# Patient Record
Sex: Female | Born: 1980 | Marital: Married | Smoking: Former smoker
Health system: Southern US, Community
[De-identification: ages and names within clinical notes are randomized; demographics above are authoritative.]

## PROBLEM LIST (undated history)

## (undated) HISTORY — PX: BREAST SURGERY: SHX581

## (undated) HISTORY — PX: TUBAL LIGATION: SHX77

## (undated) HISTORY — PX: ABDOMINAL HYSTERECTOMY: SHX81

---

## 2015-03-22 ENCOUNTER — Encounter (HOSPITAL_BASED_OUTPATIENT_CLINIC_OR_DEPARTMENT_OTHER): Payer: Self-pay

## 2015-03-22 ENCOUNTER — Ambulatory Visit (HOSPITAL_BASED_OUTPATIENT_CLINIC_OR_DEPARTMENT_OTHER)
Admission: RE | Admit: 2015-03-22 | Discharge: 2015-03-22 | Disposition: A | Discharge status: Home / Self Care | Payer: BLUE CROSS/BLUE SHIELD | Source: Ambulatory Visit | Attending: Family Medicine | Admitting: Family Medicine

## 2015-03-22 ENCOUNTER — Other Ambulatory Visit (HOSPITAL_BASED_OUTPATIENT_CLINIC_OR_DEPARTMENT_OTHER): Payer: Self-pay | Admitting: Family Medicine

## 2015-03-22 DIAGNOSIS — R1031 Right lower quadrant pain: Secondary | ICD-10-CM | POA: Diagnosis not present

## 2015-03-22 DIAGNOSIS — R102 Pelvic and perineal pain: Secondary | ICD-10-CM

## 2015-03-22 MED ORDER — IOHEXOL 300 MG/ML  SOLN
100.0000 mL | Freq: Once | INTRAMUSCULAR | Status: AC | PRN
  Administered 2015-03-22: 100 mL via INTRAVENOUS

## 2016-01-26 ENCOUNTER — Encounter: Payer: Self-pay | Admitting: Emergency Medicine

## 2016-01-26 ENCOUNTER — Emergency Department
Admission: EM | Admit: 2016-01-26 | Discharge: 2016-01-26 | Disposition: A | Discharge status: Home / Self Care | Payer: PRIVATE HEALTH INSURANCE | Source: Home / Self Care | Attending: Family Medicine | Admitting: Family Medicine

## 2016-01-26 DIAGNOSIS — M62830 Muscle spasm of back: Secondary | ICD-10-CM

## 2016-01-26 DIAGNOSIS — M5441 Lumbago with sciatica, right side: Secondary | ICD-10-CM

## 2016-01-26 MED ORDER — METHYLPREDNISOLONE ACETATE 80 MG/ML IJ SUSP
80.0000 mg | Freq: Once | INTRAMUSCULAR | Status: AC
  Administered 2016-01-26: 80 mg via INTRAMUSCULAR

## 2016-01-26 MED ORDER — TRAMADOL HCL 50 MG PO TABS: 50.0000 mg | ORAL_TABLET | Freq: Four times a day (QID) | ORAL | 0 refills | Status: DC | PRN

## 2016-01-26 MED ORDER — CYCLOBENZAPRINE HCL 10 MG PO TABS: 10.0000 mg | ORAL_TABLET | Freq: Two times a day (BID) | ORAL | 0 refills | Status: DC | PRN

## 2016-01-26 MED ORDER — PREDNISONE 20 MG PO TABS: ORAL_TABLET | ORAL | 0 refills | Status: DC

## 2016-01-26 NOTE — ED Provider Notes (Signed)
CSN: [id number]     Arrival date & time 01/26/16  1147 History   First MD Initiated Contact with Patient 01/26/16 1208     Chief Complaint  Patient presents with  . Back Pain   (Consider location/radiation/quality/duration/timing/severity/associated sxs/prior Treatment) HPI  Linda Villarreal is a 35 y.o. female presenting to UC with c/o sudden onset lower back pain that started around 3AM this morning when pt work up for work. 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 She did not go to work today. Pt notes she was unable to fully sit up in bed due to severe sharp pain and muscle spasm in her back.  Pain is 8/10 at worst. No medication taken PTA as pt notes she is allergic to ibuprofen- "throat closes up" and acetaminophen "doesn't do anything."  Pain radiates minimally down Right leg w/o numbness or weakness in legs. Denies change in bowel or bladder habits. Pain is worse with position changes. She has had mild back pain in the past but never this bad. She does not recall any specific injuries but she did go to [place] 2 days ago and was on some roller coasters.  She did not have any back pain yesterday. No hx of back surgeries.    History reviewed. No pertinent past medical history. History reviewed. No pertinent surgical history. History reviewed. No pertinent family history. Social History  Substance Use Topics  . Smoking status: Former Research scientist (life sciences)  . Smokeless tobacco: Never Used  . Alcohol use No   OB History    No data available     Review of Systems  Constitutional: Negative for chills and fever.  Genitourinary: Negative for dysuria, flank pain, frequency and hematuria.  Musculoskeletal: Positive for back pain, gait problem ( due to back pain) and myalgias. Negative for arthralgias and joint swelling.  Skin: Negative for color change and rash.  Neurological: Negative for weakness and numbness.    Allergies  Ibuprofen  Home Medications   Prior to Admission  medications   Medication Sig Start Date End Date Taking? Authorizing Provider  cyclobenzaprine (FLEXERIL) 10 MG tablet Take 1 tablet (10 mg total) by mouth 2 (two) times daily as needed for muscle spasms. 01/26/16   Noland Fordyce, PA-C  predniSONE (DELTASONE) 20 MG tablet 3 tabs po day one, then 2 po daily x 4 days 01/26/16   Noland Fordyce, PA-C  traMADol (ULTRAM) 50 MG tablet Take 1 tablet (50 mg total) by mouth every 6 (six) hours as needed. 01/26/16   Noland Fordyce, PA-C   Meds Ordered and Administered this Visit   Medications  methylPREDNISolone acetate (DEPO-MEDROL) injection 80 mg (80 mg Intramuscular Given 01/26/16 1220)    BP 141/92 (BP Location: Left Arm)   Pulse 72   Temp 98 F (36.7 C) (Oral)   Resp 18   Ht 5\' 4"  (1.626 m)   Wt 179 lb 12 oz (81.5 kg)   SpO2 98%   BMI 30.85 kg/m  No data found.   Physical Exam  Constitutional: She is oriented to person, place, and time. She appears well-developed and well-nourished.  HENT:  Head: Normocephalic and atraumatic.  Eyes: EOM are normal.  Neck: Normal range of motion.  Cardiovascular: Normal rate.   Pulmonary/Chest: Effort normal.  Musculoskeletal: Normal range of motion. She exhibits tenderness. She exhibits no edema.  No midline spinal tenderness. Mild tenderness to Right lower lumbar muscles and Right buttock.  Slow change in positions from sitting to standing and sitting  to lying due to pain. Unable to perform Right straight leg raise due to severe pain. Full flexion and extension and knees and ankles with 5/5 strength. Antalgic gait.   Neurological: She is alert and oriented to person, place, and time.  Reflex Scores:      Patellar reflexes are 2+ on the right side and 2+ on the left side. Skin: Skin is warm and dry.  Psychiatric: She has a normal mood and affect. Her behavior is normal.  Nursing note and vitals reviewed.   Urgent Care Course   Clinical Course    Procedures (including critical care  time)  Labs Review Labs Reviewed - No data to display  Imaging Review No results found.    MDM   1. Muscle spasm of back   2. Bilateral low back pain with right-sided sciatica    Pt c/o sudden onset lower back pain and muscle spasms. No red flag symptoms.  No hx of known injury. No imaging indicated at this time.  Depomedrol 80mg  IM given in UC along with heat pack.  Rx: tramadol, flexeril and prednisone  Home care instructions and exercises provided. Work note provided for tomorrow off, return Tuesday with light duty for 3 days. F/u with PCP or Sports Medicine if not improving or for recurrent back pain.  Patient verbalized understanding and agreement with treatment plan.     Noland Fordyce, PA-C 01/26/16 1238

## 2016-01-26 NOTE — ED Notes (Signed)
No adverse reaction noted to the injection Depomedrol

## 2016-01-26 NOTE — ED Triage Notes (Signed)
Patient woke this morning with C/O lower back pain 8/10 sharp constant pain with occasional spasms. Patient went to [employer] two days ago, questionable injury.

## 2016-01-26 NOTE — Discharge Instructions (Signed)
°  Flexeril is a muscle relaxer and may cause drowsiness. Do not drink alcohol, drive, or operate heavy machinery while taking.  Tramadol is strong pain medication. While taking, do not drink alcohol, drive, or perform any other activities that requires focus while taking these medications.   You were given a shot of depomedrol (a steroid) today to help with inflammation and pain in your back.  You have been prescribed 5 days of prednisone, an oral steroid.  You may start this medication tomorrow with breakfast.

## 2016-01-31 ENCOUNTER — Emergency Department (INDEPENDENT_AMBULATORY_CARE_PROVIDER_SITE_OTHER): Payer: PRIVATE HEALTH INSURANCE

## 2016-01-31 ENCOUNTER — Emergency Department (INDEPENDENT_AMBULATORY_CARE_PROVIDER_SITE_OTHER)
Admission: EM | Admit: 2016-01-31 | Discharge: 2016-01-31 | Disposition: A | Discharge status: Home / Self Care | Payer: PRIVATE HEALTH INSURANCE | Source: Home / Self Care | Attending: Family Medicine | Admitting: Family Medicine

## 2016-01-31 ENCOUNTER — Encounter: Payer: Self-pay | Admitting: Emergency Medicine

## 2016-01-31 DIAGNOSIS — M609 Myositis, unspecified: Secondary | ICD-10-CM

## 2016-01-31 DIAGNOSIS — M545 Low back pain, unspecified: Secondary | ICD-10-CM

## 2016-01-31 DIAGNOSIS — M7521 Bicipital tendinitis, right shoulder: Secondary | ICD-10-CM

## 2016-01-31 DIAGNOSIS — IMO0001 Reserved for inherently not codable concepts without codable children: Secondary | ICD-10-CM | POA: Insufficient documentation

## 2016-01-31 DIAGNOSIS — M461 Sacroiliitis, not elsewhere classified: Secondary | ICD-10-CM

## 2016-01-31 DIAGNOSIS — M7711 Lateral epicondylitis, right elbow: Secondary | ICD-10-CM

## 2016-01-31 DIAGNOSIS — M791 Myalgia: Secondary | ICD-10-CM | POA: Diagnosis not present

## 2016-01-31 NOTE — ED Provider Notes (Signed)
[facility]    CSN: [id number] Arrival date & time: 01/31/16  1215  First Provider Contact:  First MD Initiated Contact with Patient 01/31/16 1237        History   Chief Complaint Chief Complaint  Patient presents with  . Back Pain    HPI Linda Villarreal is a 35 y.o. female.   Patient was evaluated and treated here 5 days ago for bilateral low back pain with right side sciatica.  She received DepoMedrol injection followed by oral prednisone, tramadol, and Flexeril. She reports that her back pain has not improved, and radiates to her right leg. She also complains of vague tingling in her right forearm to her right shoulder.   The history is provided by the patient.    History reviewed. No pertinent past medical history.  Patient Active Problem List   Diagnosis Date Noted  . Right low back pain 01/31/2016  . Myalgia and myositis     History reviewed. No pertinent surgical history.  OB History    No data available       Home Medications    Prior to Admission medications   Medication Sig Start Date End Date Taking? Authorizing Provider  cyclobenzaprine (FLEXERIL) 10 MG tablet Take 1 tablet (10 mg total) by mouth 2 (two) times daily as needed for muscle spasms. 01/26/16   Noland Fordyce, PA-C  traMADol (ULTRAM) 50 MG tablet Take 1 tablet (50 mg total) by mouth every 6 (six) hours as needed. 01/26/16   Noland Fordyce, PA-C    Family History No family history on file.  Social History Social History  Substance Use Topics  . Smoking status: Former Research scientist (life sciences)  . Smokeless tobacco: Never Used  . Alcohol use No     Allergies   Ibuprofen   Review of Systems Review of Systems  Constitutional: Negative.   HENT: Negative.   Eyes: Negative.   Respiratory: Negative.   Cardiovascular: Negative.   Gastrointestinal: Negative.   Genitourinary: Negative.   Musculoskeletal: Positive for back pain.  Skin: Negative.   Neurological: Negative.       Physical Exam Triage Vital Signs ED Triage Vitals  Enc Vitals Group     BP 01/31/16 1230 142/86     Pulse Rate 01/31/16 1230 97     Resp --      Temp 01/31/16 1230 98.2 F (36.8 C)     Temp Source 01/31/16 1230 Oral     SpO2 01/31/16 1230 97 %     Weight 01/31/16 1230 186 lb (84.4 kg)     Height 01/31/16 1230 5\' 5"  (1.651 m)     Head Circumference --      Peak Flow --      Pain Score 01/31/16 1232 7     Pain Loc --      Pain Edu? --      Excl. in [place]? --    No data found.   Updated Vital Signs BP 142/86 (BP Location: Left Arm)   Pulse 97   Temp 98.2 F (36.8 C) (Oral)   Ht 5\' 5"  (1.651 m)   Wt 186 lb (84.4 kg)   SpO2 97%   BMI 30.95 kg/m   Visual Acuity Right Eye Distance:   Left Eye Distance:   Bilateral Distance:    Right Eye Near:   Left Eye Near:    Bilateral Near:     Physical Exam  Constitutional: She is oriented to person, place, and time. She  appears well-developed and well-nourished. No distress.  HENT:  Head: Normocephalic.  Nose: Nose normal.  Mouth/Throat: Oropharynx is clear and moist.  Eyes: Conjunctivae are normal. Pupils are equal, round, and reactive to light.  Neck: Normal range of motion.  Cardiovascular: Normal heart sounds.   Pulmonary/Chest: Breath sounds normal.  Abdominal: Bowel sounds are normal.  Musculoskeletal: She exhibits no edema.       Back:       Arms: Back:  Range of motion relatively well preserved.  Can heel/toe walk and squat without difficulty.   Tenderness in the midline at approximately L5-S1.  Tenderness in the left paraspinous muscles from L3 to Sacral area.  Straight leg raising test is negative.  Sitting knee extension test is negative.  Strength and sensation in the lower extremities is normal.  Patellar and achilles reflexes are normal.  FABER positive for right SI joint.  There is distinct tenderness over the right lateral epicondyle.  Palpation there during resisted dorsiflexion and supination of the  wrist recreates right elbow pain.  There is distinct tenderness to palpation over the insertion of the long head of the right biceps tendon.   Neurological: She is alert and oriented to person, place, and time.  Nursing note and vitals reviewed.    UC Treatments / Results  Labs (all labs ordered are listed, but only abnormal results are displayed) Labs Reviewed - No data to display  EKG  EKG Interpretation None       Radiology No results found.  Procedures Procedures (including critical care time)  Medications Ordered in UC Medications - No data to display   Initial Impression / Assessment and Plan / UC Course  I have reviewed the triage vital signs and the nursing notes.  Pertinent labs & imaging results that were available during my care of the patient were reviewed by me and considered in my medical decision making (see chart for details).  Clinical Course  Will arrange consultation with Dr. Aundria Mems for local SI joint injection and further management. May continue muscle relaxant Begin right shoulder and elbow exercises. Followup with Dr. Aundria Mems     Final Clinical Impressions(s) / UC Diagnoses   Final diagnoses:  SI (sacroiliac) joint inflammation ([place])  Right lateral epicondylitis  Biceps tendonitis, right    New Prescriptions Discharge Medication List as of 01/31/2016  3:30 PM       Kandra Nicolas, MD 02/09/16 1356

## 2016-01-31 NOTE — Discharge Instructions (Addendum)
May continue muscle relaxant. Begin right shoulder and elbow exercises.

## 2016-01-31 NOTE — Consult Note (Signed)
   Subjective:    I'm seeing this patient as a consultation for:  Dr. Theone Murdoch  CC: Right-sided back pain  HPI: This is a pleasant 35 year old female with a several day history of worsening back pain in the right side localized at the SI joint, she's had a few courses of steroids by urgent care providers, tramadol, muscle relaxers without much improvement. Pain is localized, worse with sitting, flexion, Valsalva and localized over the SI joint.  Past medical history, Surgical history, Family history not pertinant except as noted below, Social history, Allergies, and medications have been entered into the medical record, reviewed, and no changes needed.   Review of Systems: No headache, visual changes, nausea, vomiting, diarrhea, constipation, dizziness, abdominal pain, skin rash, fevers, chills, night sweats, weight loss, swollen lymph nodes, body aches, joint swelling, muscle aches, chest pain, shortness of breath, mood changes, visual or auditory hallucinations.   Objective:   General: Well Developed, well nourished, and in no acute distress.  Neuro/Psych: Alert and oriented x3, extra-ocular muscles intact, able to move all 4 extremities, sensation grossly intact. Skin: Warm and dry, no rashes noted.  Respiratory: Not using accessory muscles, speaking in full sentences, trachea midline.  Cardiovascular: Pulses palpable, no extremity edema. Abdomen: Does not appear distended. Back Exam:  Inspection: Unremarkable  Motion: Flexion 45 deg, Extension 45 deg, Side Bending to 45 deg bilaterally,  Rotation to 45 deg bilaterally  SLR laying: Negative  XSLR laying: Negative  Palpable tenderness: Right SI joint. FABER: negative. Sensory change: Gross sensation intact to all lumbar and sacral dermatomes.  Reflexes: 2+ at both patellar tendons, 2+ at achilles tendons, Babinski's downgoing.  Strength at foot  Plantar-flexion: 5/5 Dorsi-flexion: 5/5 Eversion: 5/5 Inversion: 5/5  Leg strength   Quad: 5/5 Hamstring: 5/5 Hip flexor: 5/5 Hip abductors: 5/5  Gait unremarkable.  Procedure: Real-time Ultrasound Guided Injection of right SI joint Device: GE Logiq E  Verbal informed consent obtained.  Time-out conducted.  Noted no overlying erythema, induration, or other signs of local infection.  Skin prepped in a sterile fashion.  Local anesthesia: Topical Ethyl chloride.  With sterile technique and under real time ultrasound guidance:  1 mL kenalog 40, 2 mL lidocaine, 2 mL Marcaine injected easily. Completed without difficulty  Pain immediately resolved suggesting accurate placement of the medication.  Advised to call if fevers/chills, erythema, induration, drainage, or persistent bleeding.  Images permanently stored and available for review in the ultrasound unit.  Impression: Technically successful ultrasound guided injection.  Impression and Recommendations:   This case required medical decision making of moderate complexity.  Right-sided low back pain: Suspicious for SI joint dysfunction with a discogenic component. SI joint injection, physical therapy, MRI, return to see me in one month.  No problem-specific Assessment & Plan notes found for this encounter.  ___________________________________________ Gwen Her. Dianah Field, M.D., ABFM., CAQSM. Primary Care and [place] Instructor of [place] [employer] [facility]

## 2016-01-31 NOTE — ED Triage Notes (Signed)
Pt was here on Sunday, back pain not getting better now right sided back pain radiates down right leg and arm, fingers tingling, 7/10

## 2017-02-19 ENCOUNTER — Emergency Department
Admission: EM | Admit: 2017-02-19 | Discharge: 2017-02-19 | Disposition: A | Discharge status: Home / Self Care | Payer: Self-pay | Source: Home / Self Care | Attending: Family Medicine | Admitting: Family Medicine

## 2017-02-19 DIAGNOSIS — R52 Pain, unspecified: Secondary | ICD-10-CM

## 2017-02-19 DIAGNOSIS — J019 Acute sinusitis, unspecified: Secondary | ICD-10-CM

## 2017-02-19 DIAGNOSIS — J069 Acute upper respiratory infection, unspecified: Secondary | ICD-10-CM

## 2017-02-19 MED ORDER — AMOXICILLIN-POT CLAVULANATE 875-125 MG PO TABS: 1.0000 | ORAL_TABLET | Freq: Two times a day (BID) | ORAL | 0 refills | Status: DC

## 2017-02-19 NOTE — ED Provider Notes (Signed)
[facility]    CSN: [id number] Arrival date & time: 02/19/17  1710     History   Chief Complaint Chief Complaint  Patient presents with  . Sore Throat  . Headache    HPI YARIELIS FUNARO is a 36 y.o. female.   HPI  ANY MCNEICE is a 36 y.o. female presenting to UC with c/o 1 week worsening sinus congestion, and sore throat with head pressure and pain behind Left ear and burning in chest since yesterday.  She has been out of work all week due to feeling bed. Denies fever or chills but has had nausea and 1 episode of vomiting this past week.  She notes her daughter "brought the sickness home from school."    History reviewed. No pertinent past medical history.  Patient Active Problem List   Diagnosis Date Noted  . Right low back pain 01/31/2016  . Myalgia and myositis     Past Surgical History:  Procedure Laterality Date  . ABDOMINAL HYSTERECTOMY    . BREAST SURGERY     Augmentation  . CESAREAN SECTION    . TUBAL LIGATION      OB History    No data available       Home Medications    Prior to Admission medications   Medication Sig Start Date End Date Taking? Authorizing Provider  amoxicillin-clavulanate (AUGMENTIN) 875-125 MG tablet Take 1 tablet by mouth 2 (two) times daily. One po bid x 7 days 02/19/17   Noe Gens, PA-C  cyclobenzaprine (FLEXERIL) 10 MG tablet Take 1 tablet (10 mg total) by mouth 2 (two) times daily as needed for muscle spasms. 01/26/16   Noe Gens, PA-C  traMADol (ULTRAM) 50 MG tablet Take 1 tablet (50 mg total) by mouth every 6 (six) hours as needed. 01/26/16   Noe Gens, PA-C    Family History History reviewed. No pertinent family history.  Social History Social History  Substance Use Topics  . Smoking status: Former Research scientist (life sciences)  . Smokeless tobacco: Never Used  . Alcohol use No     Allergies   Ibuprofen   Review of Systems Review of Systems  Constitutional: Positive for fatigue. Negative for chills  and fever.  HENT: Positive for congestion, ear pain (Left), rhinorrhea, sinus pain, sinus pressure and sore throat. Negative for trouble swallowing and voice change.   Respiratory: Positive for cough. Negative for shortness of breath.   Cardiovascular: Negative for chest pain and palpitations.  Gastrointestinal: Positive for nausea and vomiting. Negative for abdominal pain and diarrhea.  Musculoskeletal: Positive for arthralgias and myalgias. Negative for back pain.  Skin: Negative for rash.  Neurological: Positive for headaches. Negative for dizziness and light-headedness.     Physical Exam Triage Vital Signs ED Triage Vitals [02/19/17 1734]  Enc Vitals Group     BP 134/87     Pulse Rate 96     Resp      Temp 98.3 F (36.8 C)     Temp Source Oral     SpO2 97 %     Weight 181 lb (82.1 kg)     Height 5\' 5"  (1.651 m)     Head Circumference      Peak Flow      Pain Score 5     Pain Loc      Pain Edu?      Excl. in [place]?    No data found.   Updated Vital Signs BP 134/87 (BP  Location: Left Arm)   Pulse 96   Temp 98.3 F (36.8 C) (Oral)   Ht 5\' 5"  (1.651 m)   Wt 181 lb (82.1 kg)   SpO2 97%   BMI 30.12 kg/m   Visual Acuity Right Eye Distance:   Left Eye Distance:   Bilateral Distance:    Right Eye Near:   Left Eye Near:    Bilateral Near:     Physical Exam  Constitutional: She is oriented to person, place, and time. She appears well-developed and well-nourished. No distress.  HENT:  Head: Normocephalic and atraumatic.  Right Ear: Tympanic membrane normal.  Left Ear: Tympanic membrane normal.  Nose: Mucosal edema present. Right sinus exhibits maxillary sinus tenderness and frontal sinus tenderness. Left sinus exhibits maxillary sinus tenderness and frontal sinus tenderness.  Mouth/Throat: Uvula is midline and mucous membranes are normal. Posterior oropharyngeal erythema present. No oropharyngeal exudate, posterior oropharyngeal edema or tonsillar abscesses.  Eyes:  EOM are normal.  Neck: Normal range of motion. Neck supple.  Cardiovascular: Normal rate and regular rhythm.   Pulmonary/Chest: Effort normal and breath sounds normal. No stridor. No respiratory distress. She has no wheezes. She has no rales.  Musculoskeletal: Normal range of motion.  Lymphadenopathy:    She has no cervical adenopathy.  Neurological: She is alert and oriented to person, place, and time.  Skin: Skin is warm and dry. She is not diaphoretic.  Psychiatric: She has a normal mood and affect. Her behavior is normal.  Nursing note and vitals reviewed.    UC Treatments / Results  Labs (all labs ordered are listed, but only abnormal results are displayed) Labs Reviewed - No data to display  EKG  EKG Interpretation None       Radiology No results found.  Procedures Procedures (including critical care time)  Medications Ordered in UC Medications - No data to display   Initial Impression / Assessment and Plan / UC Course  I have reviewed the triage vital signs and the nursing notes.  Pertinent labs & imaging results that were available during my care of the patient were reviewed by me and considered in my medical decision making (see chart for details).     Hx and exam c/w sinus infection, will cover for bacterial cause given worsening symptoms. F/u with PCP in 1 week if needed.   Final Clinical Impressions(s) / UC Diagnoses   Final diagnoses:  Acute rhinosinusitis  Body aches  Upper respiratory tract infection, unspecified type    New Prescriptions Discharge Medication List as of 02/19/2017  5:50 PM    START taking these medications   Details  amoxicillin-clavulanate (AUGMENTIN) 875-125 MG tablet Take 1 tablet by mouth 2 (two) times daily. One po bid x 7 days, Starting Fri 02/19/2017, Normal         Controlled Substance Prescriptions [place] Controlled Substance Registry consulted? Not Applicable   Tyrell Antonio 02/19/17 1819

## 2017-02-19 NOTE — ED Triage Notes (Signed)
Pt started with a sore throat and headache last Friday.  All week has been very fatigued.  Yesterday started with head pressure, ear pain behind the ear, and burning in her chest.  Has been out of work all week.

## 2017-02-19 NOTE — Discharge Instructions (Signed)
°  You may take 500mg acetaminophen every 4-6 hours or in combination with ibuprofen 400-600mg every 6-8 hours as needed for pain, inflammation, and fever. ° °Be sure to drink at least eight 8oz glasses of water to stay well hydrated and get at least 8 hours of sleep at night, preferably more while sick.  ° °

## 2017-02-21 ENCOUNTER — Telehealth: Payer: Self-pay | Admitting: Emergency Medicine

## 2017-03-26 ENCOUNTER — Other Ambulatory Visit: Payer: Self-pay | Admitting: Podiatry

## 2017-03-26 ENCOUNTER — Ambulatory Visit (INDEPENDENT_AMBULATORY_CARE_PROVIDER_SITE_OTHER): Payer: 59 | Admitting: Podiatry

## 2017-03-26 ENCOUNTER — Ambulatory Visit (INDEPENDENT_AMBULATORY_CARE_PROVIDER_SITE_OTHER): Payer: 59

## 2017-03-26 ENCOUNTER — Encounter: Payer: Self-pay | Admitting: Podiatry

## 2017-03-26 VITALS — BP 131/86 | HR 80 | Resp 16

## 2017-03-26 DIAGNOSIS — M79671 Pain in right foot: Secondary | ICD-10-CM | POA: Diagnosis not present

## 2017-03-26 DIAGNOSIS — M779 Enthesopathy, unspecified: Secondary | ICD-10-CM

## 2017-03-26 DIAGNOSIS — M7751 Other enthesopathy of right foot: Secondary | ICD-10-CM | POA: Diagnosis not present

## 2017-03-26 DIAGNOSIS — Q742 Other congenital malformations of lower limb(s), including pelvic girdle: Secondary | ICD-10-CM | POA: Diagnosis not present

## 2017-03-26 DIAGNOSIS — M25571 Pain in right ankle and joints of right foot: Secondary | ICD-10-CM

## 2017-03-26 MED ORDER — TRIAMCINOLONE ACETONIDE 10 MG/ML IJ SUSP
10.0000 mg | Freq: Once | INTRAMUSCULAR | Status: AC
  Administered 2017-03-26: 10 mg

## 2017-03-26 MED ORDER — DICLOFENAC SODIUM 75 MG PO TBEC: 75.0000 mg | DELAYED_RELEASE_TABLET | Freq: Two times a day (BID) | ORAL | 2 refills | Status: DC

## 2017-03-26 NOTE — Progress Notes (Signed)
Subjective:    Patient ID: Linda Villarreal, female   DOB: 36 y.o.   MRN: [id number]   HPI patient presents stating she's had chronic pain in the inside of her right ankle for the last few years and is developed a lot of pain in the outside of the right ankle over the last few months. 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 Patient does not smoke currently    Review of Systems  All other systems reviewed and are negative.       Objective:  Physical Exam  Constitutional: She appears well-developed and well-nourished.  Cardiovascular: Intact distal pulses.   Pulmonary/Chest: Effort normal.  Musculoskeletal: Normal range of motion.  Neurological: She is alert.  Skin: Skin is warm.  Nursing note and vitals reviewed.  neurovascular status intact muscle strength adequate range of motion was within normal limits. Patient was found to have quite a bit of discomfort in the posterior tibial insertion right with hypertrophy of the navicular around this area and probability for accessory navicular. There is quite a bit of acute pain in the outside of the right ankle along the lateral ankle gutter and also the sinus tarsi is moderately tender when pressed. 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 I did careful lateral sheath injection 3 mg Kenalog 5 mg Xylocaine and I applied fascial brace to lift up the lateral side of the foot and I then went ahead discussed ice therapy and anti-inflammatories and long-term correction of the accessory navicular due to two-year history of problem. She is placed on diclofenac 75  mg twice a day and will be seen back to recheck and we will need to get x-rays of both feet at that time with oblique views to determine the extensiveness of the navicular  X-rays indicate probable accessory navicular right with moderate depression of the arch and no indication of ankle instability

## 2017-03-26 NOTE — Progress Notes (Signed)
   Subjective:    Patient ID: Linda Villarreal, female    DOB: [birth year], 36 y.o.   MRN: [id number]  HPI Chief Complaint  Patient presents with  . Foot Pain    Lateral foot and ankle/medial foot right - aching and swelling x 2 months, "feels like something is moving", ortho said had acessory navicular, did PT - no better      Review of Systems  All other systems reviewed and are negative.      Objective:   Physical Exam        Assessment & Plan:

## 2017-04-09 ENCOUNTER — Encounter: Payer: Self-pay | Admitting: Podiatry

## 2017-04-09 ENCOUNTER — Ambulatory Visit (INDEPENDENT_AMBULATORY_CARE_PROVIDER_SITE_OTHER): Payer: 59

## 2017-04-09 ENCOUNTER — Ambulatory Visit (INDEPENDENT_AMBULATORY_CARE_PROVIDER_SITE_OTHER): Payer: 59 | Admitting: Podiatry

## 2017-04-09 DIAGNOSIS — M7751 Other enthesopathy of right foot: Secondary | ICD-10-CM

## 2017-04-09 DIAGNOSIS — D169 Benign neoplasm of bone and articular cartilage, unspecified: Secondary | ICD-10-CM

## 2017-04-09 DIAGNOSIS — M7750 Other enthesopathy of unspecified foot: Secondary | ICD-10-CM

## 2017-04-09 NOTE — Progress Notes (Signed)
Subjective:    Patient ID: Linda Villarreal, female   DOB: 36 y.o.   MRN: [id number]   HPI patient states the outside of her ankle is some improved from previous but still sore and she continues to have the same chronic problem with the inside of the ankle and wants to get it fixed    ROS      Objective:  Physical Exam neurovascular status intact muscle strength adequate range of motion within normal limits with patient found to have inflammation in the outside of the right foot that still present but improved with significant inflammation pain of the posterior tibial tendon insertion right that's chronic in nature and has been present for a long time with depression of the arch noted and enlargement of the bone structure over the left navicular     Assessment:   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 I do think that this is can require excision of accessory navicular with tightening the posterior tibial tendon and a Kidner type procedure. I explained to her at great length and reviewed what would be required surgically and she wants to get this done but needs to wait till January. At this point I allowed her to read consent form going over all complications and the fact that total recovery from this will take 6 months to one year and she will be nonweightbearing for probably around 2-4 weeks. Patient understands all this and signs consent form after extensive review and discussion of alternative treatments complications. I will also inject the lateral tendon complex along with the Kidner procedure and I dispensed air fracture walker with all instructions on usage and I like her to wear prior surgery to reduce inflammation    X-ray report indicated that there is large accessory navicular on the medial side of  the right navicular with small accessory navicular on the left

## 2017-04-09 NOTE — Patient Instructions (Signed)
Pre-Operative Instructions  Congratulations, you have decided to take an important step towards improving your quality of life.  You can be assured that the doctors and staff at [facility] will be with you every step of the way.  Here are some important things you should know:  1. Plan to be at the surgery center/hospital at least 1 (one) hour prior to your scheduled time, unless otherwise directed by the surgical center/hospital staff.  You must have a responsible adult accompany you, remain during the surgery and drive you home.  Make sure you have directions to the surgical center/hospital to ensure you arrive on time. 2. If you are having surgery at [facility] or [facility], you will need a copy of your medical history and physical form from your family physician within one month prior to the date of surgery. We will give you a form for your primary physician to complete.  3. We make every effort to accommodate the date you request for surgery.  However, there are times where surgery dates or times have to be moved.  We will contact you as soon as possible if a change in schedule is required.   4. No aspirin/ibuprofen for one week before surgery.  If you are on aspirin, any non-steroidal anti-inflammatory medications (Mobic, Aleve, Ibuprofen) should not be taken seven (7) days prior to your surgery.  You make take Tylenol for pain prior to surgery.  5. Medications - If you are taking daily heart and blood pressure medications, seizure, reflux, allergy, asthma, anxiety, pain or diabetes medications, make sure you notify the surgery center/hospital before the day of surgery so they can tell you which medications you should take or avoid the day of surgery. 6. No food or drink after midnight the night before surgery unless directed otherwise by surgical center/hospital staff. 7. No alcoholic beverages 24-hours prior to surgery.  No smoking 24-hours prior or 24-hours after  surgery. 8. Wear loose pants or shorts. They should be loose enough to fit over bandages, boots, and casts. 9. Don't wear slip-on shoes. Sneakers are preferred. 10. Bring your boot with you to the surgery center/hospital.  Also bring crutches or a walker if your physician has prescribed it for you.  If you do not have this equipment, it will be provided for you after surgery. 11. If you have not been contacted by the surgery center/hospital by the day before your surgery, call to confirm the date and time of your surgery. 12. Leave-time from work may vary depending on the type of surgery you have.  Appropriate arrangements should be made prior to surgery with your employer. 13. Prescriptions will be provided immediately following surgery by your doctor.  Fill these as soon as possible after surgery and take the medication as directed. Pain medications will not be refilled on weekends and must be approved by the doctor. 14. Remove nail polish on the operative foot and avoid getting pedicures prior to surgery. 15. Wash the night before surgery.  The night before surgery wash the foot and leg well with water and the antibacterial soap provided. Be sure to pay special attention to beneath the toenails and in between the toes.  Wash for at least three (3) minutes. Rinse thoroughly with water and dry well with a towel.  Perform this wash unless told not to do so by your physician.  Enclosed: 1 Ice pack (please put in freezer the night before surgery)   1 Hibiclens skin cleaner     Pre-op instructions  If you have any questions regarding the instructions, please do not hesitate to call our office.  [place]: [address], [place], [place] [address] -- [phone]  [place]: [address] [place], [place] [address] -- [phone]  [place]: [address]  [place], [place] [address] -- [phone]  [place]: [address], [address], [place], [place] [address] -- [phone]  Website:  [email or web address] 

## 2017-04-29 ENCOUNTER — Encounter: Payer: Self-pay | Admitting: Podiatry

## 2017-04-29 ENCOUNTER — Ambulatory Visit (INDEPENDENT_AMBULATORY_CARE_PROVIDER_SITE_OTHER): Payer: 59 | Admitting: Podiatry

## 2017-04-29 ENCOUNTER — Ambulatory Visit (INDEPENDENT_AMBULATORY_CARE_PROVIDER_SITE_OTHER): Payer: 59

## 2017-04-29 VITALS — BP 141/97 | HR 83 | Resp 16

## 2017-04-29 DIAGNOSIS — S99921A Unspecified injury of right foot, initial encounter: Secondary | ICD-10-CM

## 2017-04-29 DIAGNOSIS — M7751 Other enthesopathy of right foot: Secondary | ICD-10-CM

## 2017-04-29 NOTE — Progress Notes (Signed)
Subjective:   Patient ID: Linda Villarreal, female   DOB: 36 y.o.   MRN: [id number]   HPI Patient presents stating that she dropped a pallet at work on her right foot on the back and it has been very sore.  She is worried that she might have broken a bone and she is having trouble walking.  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   ROS      Objective:  Physical Exam  Neurovascular status intact quite a bit of inflammation in the lateral side of the right foot around the perineal complex with the patient's medial arch still very sore with surgery scheduled for this coming February.  There is redness on the outside of the right foot but there is no abrasion or break in skin with moderate edema noted     Assessment:  Acute injury to the right lateral foot consistent with inflammation with possibility for fracture     Plan:  H&P x-rays reviewed and condition discussed.  At this time I applied an Unna boot Ace wrap surgical shoe to completely take stress off the lateral foot and patient will be seen back again to recheck may require complete immobilization.  Patient is to be off work for the next 4 days and hopefully she will be able to return to work next week without a problem.  X-rays indicate there is no indication currently of fracture ablation of accessory navicular that is going to be correct February.

## 2017-05-13 ENCOUNTER — Telehealth: Payer: Self-pay | Admitting: *Deleted

## 2017-05-13 ENCOUNTER — Telehealth: Payer: Self-pay | Admitting: Podiatry

## 2017-05-13 ENCOUNTER — Encounter: Payer: Self-pay | Admitting: *Deleted

## 2017-05-13 NOTE — Telephone Encounter (Signed)
Linda Villarreal  needs to know if she can go back to work and if so she needs the ok per Dr. Paulla Dolly. She stated she left a message and I did let her know we where out two days. Please call [phone]

## 2017-05-13 NOTE — Telephone Encounter (Signed)
Pt asked if she needed to be seen again to get note to return to work, Dr. Paulla Dolly stated at last visit she could return to work 05/03/2017, but has been out of work this week and would like to return work 05/17/2017. Dr. Josephina Shih pt to return to work 05/17/2017, no restrictions noted on 04/29/2017 office note. Pt states she will pick up the note in the [place] office.

## 2017-06-01 HISTORY — PX: FOOT SURGERY: SHX648

## 2017-06-02 ENCOUNTER — Encounter: Payer: Self-pay | Admitting: Podiatry

## 2017-06-02 ENCOUNTER — Ambulatory Visit (INDEPENDENT_AMBULATORY_CARE_PROVIDER_SITE_OTHER): Payer: 59

## 2017-06-02 ENCOUNTER — Other Ambulatory Visit: Payer: Self-pay | Admitting: Podiatry

## 2017-06-02 ENCOUNTER — Ambulatory Visit (INDEPENDENT_AMBULATORY_CARE_PROVIDER_SITE_OTHER): Payer: 59 | Admitting: Podiatry

## 2017-06-02 VITALS — BP 118/86 | HR 90 | Resp 16

## 2017-06-02 DIAGNOSIS — T148XXA Other injury of unspecified body region, initial encounter: Secondary | ICD-10-CM

## 2017-06-02 DIAGNOSIS — S99921D Unspecified injury of right foot, subsequent encounter: Secondary | ICD-10-CM

## 2017-06-02 NOTE — Progress Notes (Signed)
Subjective:   Patient ID: Linda Villarreal, female   DOB: 37 y.o.   MRN: [id number]   HPI Patient presents stating she feels like the outside of her right foot is bothering him more after the injury she sustained at work.  Patient states she feels like it is popping and is not stable and has become increasingly difficult for her to work or walk over the last 10-14 days   ROS      Objective:  Physical Exam  Neurovascular status intact with what appears to be a dislocating peroneal tendon right with the possibility that she may have sustained a sheath injury when she had her injury at the end of November and also there is a possibility of perineal tear right with instability after the injury she sustained     Assessment:  Possibility for dislocating peroneal tendon versus possibility for tear     Plan:  H&P condition reviewed and at this time I am sending for MRI to rule out a tear of the peroneal tendon and also to evaluate the sheath on the lateral side.  Patient will be seen back when we get results and we are to move her surgery up as she needs to get it done as soon as possible due to the pain she is in.  I dispensed a full ankle stabilizer to try to buy her time and provide for a degree of stability

## 2017-06-09 ENCOUNTER — Ambulatory Visit
Admission: RE | Admit: 2017-06-09 | Discharge: 2017-06-09 | Disposition: A | Discharge status: Home / Self Care | Payer: 59 | Source: Ambulatory Visit | Attending: Podiatry | Admitting: Podiatry

## 2017-06-09 DIAGNOSIS — S99921D Unspecified injury of right foot, subsequent encounter: Secondary | ICD-10-CM

## 2017-06-09 DIAGNOSIS — T148XXA Other injury of unspecified body region, initial encounter: Secondary | ICD-10-CM

## 2017-06-14 ENCOUNTER — Ambulatory Visit (INDEPENDENT_AMBULATORY_CARE_PROVIDER_SITE_OTHER): Payer: 59 | Admitting: Podiatry

## 2017-06-14 ENCOUNTER — Encounter: Payer: Self-pay | Admitting: Podiatry

## 2017-06-14 DIAGNOSIS — D169 Benign neoplasm of bone and articular cartilage, unspecified: Secondary | ICD-10-CM | POA: Diagnosis not present

## 2017-06-14 DIAGNOSIS — T148XXA Other injury of unspecified body region, initial encounter: Secondary | ICD-10-CM | POA: Diagnosis not present

## 2017-06-14 NOTE — Progress Notes (Signed)
Subjective:   Patient ID: Linda Villarreal, female   DOB: 37 y.o.   MRN: [id number]   HPI Patient presents for consult concerning pain in the lateral side of her right ankle.  She is scheduled for surgery on her navicular to be done in February but on November 28 had an injury at work where a pallet traumatized her right foot and ankle and twisted her foot and she developed significant pain after that.  We did apply a immobilizing boot for several weeks and she attempted to return to work but the pain was too intense and we did order an MRI that we had just received results   ROS      Objective:  Physical Exam  MRI indicates a tear of the peroneus longus that most likely occurred with the injury that occurred in November as there was no pain prior to that injury.  There is also some popping which could indicate some damage to the sheath that is allowing the tendon to pop and a lateral direction out of its groove     Assessment:  Chronic accessory navicular bone right with a probable acute tear of the peroneal tendon     Plan:  Reviewed condition at great length and we are going to go ahead and at the same time that we fix the navicular will going to repair the peroneal tendon.  Also may have to re-groove it depending on how it looks intraoperatively and will have to make that decision at the time of surgery.  Patient at this time is scheduled for outpatient surgery and was given all preoperative instructions and the fact that this is a difficult surgery and that there is no long-term guarantees and the recovery can take anywhere from 6 months to 1 year and she will need physical therapy postoperatively.  She it will have the surgery move out due to the tear of the peroneal tendon and we will be able to get this at next week for her and again she does understand that there will be a non-weightbearing.  For probably anywhere between 3 and 6 weeks

## 2017-06-14 NOTE — Patient Instructions (Signed)
Pre-Operative Instructions  Congratulations, you have decided to take an important step towards improving your quality of life.  You can be assured that the doctors and staff at [facility] will be with you every step of the way.  Here are some important things you should know:  1. Plan to be at the surgery center/hospital at least 1 (one) hour prior to your scheduled time, unless otherwise directed by the surgical center/hospital staff.  You must have a responsible adult accompany you, remain during the surgery and drive you home.  Make sure you have directions to the surgical center/hospital to ensure you arrive on time. 2. If you are having surgery at [facility] or [facility], you will need a copy of your medical history and physical form from your family physician within one month prior to the date of surgery. We will give you a form for your primary physician to complete.  3. We make every effort to accommodate the date you request for surgery.  However, there are times where surgery dates or times have to be moved.  We will contact you as soon as possible if a change in schedule is required.   4. No aspirin/ibuprofen for one week before surgery.  If you are on aspirin, any non-steroidal anti-inflammatory medications (Mobic, Aleve, Ibuprofen) should not be taken seven (7) days prior to your surgery.  You make take Tylenol for pain prior to surgery.  5. Medications - If you are taking daily heart and blood pressure medications, seizure, reflux, allergy, asthma, anxiety, pain or diabetes medications, make sure you notify the surgery center/hospital before the day of surgery so they can tell you which medications you should take or avoid the day of surgery. 6. No food or drink after midnight the night before surgery unless directed otherwise by surgical center/hospital staff. 7. No alcoholic beverages 24-hours prior to surgery.  No smoking 24-hours prior or 24-hours after  surgery. 8. Wear loose pants or shorts. They should be loose enough to fit over bandages, boots, and casts. 9. Don't wear slip-on shoes. Sneakers are preferred. 10. Bring your boot with you to the surgery center/hospital.  Also bring crutches or a walker if your physician has prescribed it for you.  If you do not have this equipment, it will be provided for you after surgery. 11. If you have not been contacted by the surgery center/hospital by the day before your surgery, call to confirm the date and time of your surgery. 12. Leave-time from work may vary depending on the type of surgery you have.  Appropriate arrangements should be made prior to surgery with your employer. 13. Prescriptions will be provided immediately following surgery by your doctor.  Fill these as soon as possible after surgery and take the medication as directed. Pain medications will not be refilled on weekends and must be approved by the doctor. 14. Remove nail polish on the operative foot and avoid getting pedicures prior to surgery. 15. Wash the night before surgery.  The night before surgery wash the foot and leg well with water and the antibacterial soap provided. Be sure to pay special attention to beneath the toenails and in between the toes.  Wash for at least three (3) minutes. Rinse thoroughly with water and dry well with a towel.  Perform this wash unless told not to do so by your physician.  Enclosed: 1 Ice pack (please put in freezer the night before surgery)   1 Hibiclens skin cleaner     Pre-op instructions  If you have any questions regarding the instructions, please do not hesitate to call our office.  [place]: [address], [place], [place] [address] -- [phone]  [place]: [address] [place], [place] [address] -- [phone]  [place]: [address]  , [place] [address] -- [phone]  [place]: [address], [place], [place] [address] -- [phone]  Website:  [email or web address] 

## 2017-06-15 ENCOUNTER — Telehealth: Payer: Self-pay | Admitting: Podiatry

## 2017-06-15 NOTE — Telephone Encounter (Signed)
I need someone to call me back about medical records that I need to have faxed to my work. My call back number is [phone]. Thank you. Bye bye.

## 2017-06-15 NOTE — Telephone Encounter (Signed)
Called pt back in regards to message she left about her medical records. She stated she needs her records sent to the injury department at her job in order for her to get paid after she has her surgery. I told her we would need her to fill out and sign a medical records release form authorizing [place] to release her records. I told her we could e-mail her one, fax one, or she could come by the office, which ever was more convenient for her. Pt asked if she could come by tomorrow morning and I told her that was fine. I told her to ask to speak to Marcie Bal when she came in as [employer] handles all of the paperwork and records for FMLA/Short Term Disability after people had surgery. I told her our hours tomorrow are from 8:00 am - 5:00 pm. I told her to call us with any other questions or concerns.

## 2017-06-23 ENCOUNTER — Encounter: Payer: Self-pay | Admitting: Podiatry

## 2017-06-23 ENCOUNTER — Telehealth: Payer: Self-pay | Admitting: Podiatry

## 2017-06-23 DIAGNOSIS — T148XXD Other injury of unspecified body region, subsequent encounter: Secondary | ICD-10-CM | POA: Diagnosis not present

## 2017-06-23 DIAGNOSIS — M25774 Osteophyte, right foot: Secondary | ICD-10-CM | POA: Diagnosis not present

## 2017-06-23 DIAGNOSIS — T148XXA Other injury of unspecified body region, initial encounter: Secondary | ICD-10-CM | POA: Diagnosis not present

## 2017-06-23 NOTE — Telephone Encounter (Signed)
This is Mae Immunologist from [employer]. I'm calling for the prescription written for percocet. If you would please give [place] a call back at [phone]. Thank you so much. Bye bye.

## 2017-06-23 NOTE — Telephone Encounter (Signed)
Linda Villarreal states percocet 10/325mg  prescribed today equals morphine 130mEq, they can only fill for 43mEq which is one tablet 3 times a day dispensing #21. Dr. Paulla Dolly states that is fine. I informed Linda it was okay to change Percocet 10/325 #21 to one tablet every 8 hours.

## 2017-06-25 ENCOUNTER — Telehealth: Payer: Self-pay | Admitting: Podiatry

## 2017-06-25 MED ORDER — OXYCODONE-ACETAMINOPHEN 10-325 MG PO TABS: 1.0000 | ORAL_TABLET | Freq: Three times a day (TID) | ORAL | 0 refills | Status: DC | PRN

## 2017-06-25 NOTE — Addendum Note (Signed)
Addended by: Harriett Sine D on: 06/25/2017 03:04 PM   Modules accepted: Orders

## 2017-06-25 NOTE — Telephone Encounter (Signed)
I asked pt to describe the pain and she states it is sharp. I told pt that she should only be up on the foot no more than 15 minutes/hour and pt states she only gets up to go to the bathroom, but she doesn't put her weight on it and I told her that was good. I told pt then it may be the positioning of the ace wrap, to remove the boot, open-ended sock and the ace wrap only, then elevate the foot for 15 minutes, after 15 minutes lower foot to hip level and rewrap the ace looser starting at the toes and wrap slightly further up the leg, re apply the sock and boot. I told pt the percocet 10/325mg  could be prescribed again and I would fill it as every 8 hour and she should take it as every 8 hours. Pt states understanding and will send her husband to pick up.

## 2017-06-25 NOTE — Telephone Encounter (Signed)
I had surgery on Wednesday with Dr. Paulla Dolly. The prescription that Dr. Paulla Dolly had prescribed for me the pharmacy wouldn't fill the whole thing because the quantity he prescribed was more than allowed for percocet. I've been having to take every four hours due to the pain of both surgeries on my one foot. So I was wondering if I could get the last of my prescription filled so that I don't run out because I've been in a lot of pain. If you could give me a call back. My number is [phone]. Thanks.

## 2017-06-28 ENCOUNTER — Other Ambulatory Visit: Payer: 59

## 2017-06-30 ENCOUNTER — Encounter: Payer: Self-pay | Admitting: Podiatry

## 2017-06-30 ENCOUNTER — Ambulatory Visit (INDEPENDENT_AMBULATORY_CARE_PROVIDER_SITE_OTHER): Payer: 59

## 2017-06-30 ENCOUNTER — Ambulatory Visit (INDEPENDENT_AMBULATORY_CARE_PROVIDER_SITE_OTHER): Payer: 59 | Admitting: Podiatry

## 2017-06-30 VITALS — BP 141/91

## 2017-06-30 DIAGNOSIS — M7751 Other enthesopathy of right foot: Secondary | ICD-10-CM

## 2017-06-30 DIAGNOSIS — T148XXA Other injury of unspecified body region, initial encounter: Secondary | ICD-10-CM | POA: Diagnosis not present

## 2017-06-30 MED ORDER — OXYCODONE-ACETAMINOPHEN 10-325 MG PO TABS: 1.0000 | ORAL_TABLET | Freq: Three times a day (TID) | ORAL | 0 refills | Status: DC | PRN

## 2017-06-30 NOTE — Progress Notes (Signed)
Subjective:   Patient ID: Linda Villarreal, female   DOB: 37 y.o.   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  Gave instruction continue elevation compression immobilization and reappoint 2 weeks or earlier if needed  X-rays indicate that satisfactory resection of bone has occurred medial side right foot

## 2017-07-05 NOTE — Progress Notes (Signed)
DOS 06/23/17 Repair tendon Rt, Kidner advanced tendon Rt.

## 2017-07-08 ENCOUNTER — Encounter: Payer: Self-pay | Admitting: Podiatry

## 2017-07-08 ENCOUNTER — Telehealth: Payer: Self-pay | Admitting: *Deleted

## 2017-07-08 NOTE — Telephone Encounter (Signed)
"  I'm calling to inquire about Linda Villarreal.  I would like to know if it was filed with Workers' Comp or did she file it with her insurance."  I do not know you would need to speak to someone in insurance in regards to that, I can transfer you to the insurance department.  "I have other questions as well.  Will this person be able to answer questions regarding the surgery itself?"  I can answer those questions.  "I guess I don't need to know about the insurance.  When is her surgery scheduled?"  She had surgery on 06/23/2017.  "She did?  What's her diagnosis?"  She had a torn tendon.  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  "Okay, thank you so much.  That's all I need to know."

## 2017-07-14 ENCOUNTER — Ambulatory Visit (INDEPENDENT_AMBULATORY_CARE_PROVIDER_SITE_OTHER): Payer: 59 | Admitting: Podiatry

## 2017-07-14 ENCOUNTER — Encounter: Payer: Self-pay | Admitting: Podiatry

## 2017-07-14 ENCOUNTER — Ambulatory Visit (INDEPENDENT_AMBULATORY_CARE_PROVIDER_SITE_OTHER): Payer: 59

## 2017-07-14 DIAGNOSIS — T148XXA Other injury of unspecified body region, initial encounter: Secondary | ICD-10-CM

## 2017-07-14 DIAGNOSIS — D169 Benign neoplasm of bone and articular cartilage, unspecified: Secondary | ICD-10-CM | POA: Diagnosis not present

## 2017-07-14 DIAGNOSIS — R600 Localized edema: Secondary | ICD-10-CM

## 2017-07-15 ENCOUNTER — Telehealth: Payer: Self-pay | Admitting: *Deleted

## 2017-07-15 DIAGNOSIS — Z9889 Other specified postprocedural states: Secondary | ICD-10-CM

## 2017-07-15 NOTE — Progress Notes (Signed)
Subjective:   Patient ID: Linda Villarreal, female   DOB: 37 y.o.   MRN: [id number]   HPI Patient seems to be a little bit improved but states she still has swelling in her ankle and she has been nonweightbearing   ROS      Objective:  Physical Exam  Neurovascular status intact with patient found to be doing okay with incision sites both medial lateral right ankle doing well with negative Homans sign noted and wound edges well coapted with no drainage with staples stitches intact     Assessment:  Doing okay with having had perineal repair and Kidner procedure right with swelling secondary to the procedure and the nonweightbearing status     Plan:  H&P condition reviewed and gradual weightbearing encouraged.  I applied an Unna boot to try to reduce the swelling and will get weight and take the remaining staples out in 1 week if she cannot tolerate it well.  Patient also will start physical therapy and she is referred for physical therapy to try to improve motor with mobilization and reduce edema in the right ankle

## 2017-07-15 NOTE — Telephone Encounter (Signed)
Dr. Paulla Dolly ordered PT [facility] - In-office.

## 2017-07-21 ENCOUNTER — Other Ambulatory Visit: Payer: 59

## 2017-07-22 ENCOUNTER — Ambulatory Visit (INDEPENDENT_AMBULATORY_CARE_PROVIDER_SITE_OTHER): Payer: Self-pay

## 2017-07-22 DIAGNOSIS — D169 Benign neoplasm of bone and articular cartilage, unspecified: Secondary | ICD-10-CM

## 2017-07-22 DIAGNOSIS — T148XXA Other injury of unspecified body region, initial encounter: Secondary | ICD-10-CM

## 2017-07-23 NOTE — Progress Notes (Signed)
Patient presents status post repair tendon Rt, Kidner advanced tendon Rt on 1.23.19 stating that she has had an improvement in pain and is able to walk short distances at this time.   Noted improvement in swelling to medial and lateral sides of the ankle. No redness, erythema, warmth or drainage.  Surgical incision are healing well, removed some staples and sutures from both surgical sites. Noted mild gapping and slight drainage to some of the areas. Left remaining sutures and staples intact, applied light adhesive around surgical incisions and applied steri-strips to support.   Advised to keep her foot dry, minimize walking to help aid in healing and swelling. RE-appointed next week for removal of remaining sutures and staples.

## 2017-07-29 ENCOUNTER — Ambulatory Visit (INDEPENDENT_AMBULATORY_CARE_PROVIDER_SITE_OTHER): Payer: Self-pay

## 2017-07-29 DIAGNOSIS — D169 Benign neoplasm of bone and articular cartilage, unspecified: Secondary | ICD-10-CM

## 2017-07-29 DIAGNOSIS — T148XXA Other injury of unspecified body region, initial encounter: Secondary | ICD-10-CM

## 2017-08-11 NOTE — Progress Notes (Signed)
Patient presents status post repair tendon Rt, Kidner advanced tendon Rt on 1.23.19 stating that she has had an improvement in pain and is able to walk short distances at this time.   Noted improvement in swelling to medial and lateral sides of the ankle. No redness, erythema, warmth or drainage.  Surgical incision are healing well, removed all staples and sutures, applied steri-strips.   Advised to walk in post op boot. She going to [place] to visit her mother who is not doing well, she is to return and follow up with Dr Paulla Dolly in 2 weeks or sooner if needed

## 2017-08-12 ENCOUNTER — Ambulatory Visit (INDEPENDENT_AMBULATORY_CARE_PROVIDER_SITE_OTHER): Payer: 59

## 2017-08-12 ENCOUNTER — Encounter: Payer: Self-pay | Admitting: Podiatry

## 2017-08-12 ENCOUNTER — Ambulatory Visit (INDEPENDENT_AMBULATORY_CARE_PROVIDER_SITE_OTHER): Payer: Self-pay | Admitting: Podiatry

## 2017-08-12 DIAGNOSIS — T148XXA Other injury of unspecified body region, initial encounter: Secondary | ICD-10-CM

## 2017-08-12 NOTE — Progress Notes (Signed)
Subjective:   Patient ID: Linda Villarreal, female   DOB: 37 y.o.   MRN: [id number]   HPI Patient states overall doing well but still having quite a bit of pain and swelling if she is on her foot too much neurovascular status intact negative Homans sign noted with patient's right perineal incision and   ROS      Objective:  Physical Exam  Posterior tibial incisions healing well with continued edema but reduced over time and good range of motion     Assessment:  Doing well overall with swelling still noted normal for these types of procedures     Plan:  Continue with compression elevation and immobilization and reappoint 4 weeks or earlier if needed.  X-rays indicate satisfactory resection of bone we also discussed the fact her mother has liver cancer and only has 3-6 months to live

## 2017-09-13 ENCOUNTER — Encounter: Payer: 59 | Admitting: Podiatry

## 2017-09-24 ENCOUNTER — Ambulatory Visit (INDEPENDENT_AMBULATORY_CARE_PROVIDER_SITE_OTHER): Payer: 59

## 2017-09-24 ENCOUNTER — Encounter: Payer: Self-pay | Admitting: Podiatry

## 2017-09-24 ENCOUNTER — Ambulatory Visit (INDEPENDENT_AMBULATORY_CARE_PROVIDER_SITE_OTHER): Payer: Self-pay | Admitting: Podiatry

## 2017-09-24 DIAGNOSIS — T148XXA Other injury of unspecified body region, initial encounter: Secondary | ICD-10-CM

## 2017-09-26 NOTE — Progress Notes (Signed)
Subjective:   Patient ID: Linda Villarreal, female   DOB: 37 y.o.   MRN: [id number]   HPI Patient states overall doing well but still having swelling diffuse if she is on her foot for too long of a time    ROS      Objective:  Physical Exam  Neurovascular status was found to be intact with incisions on the lateral side of the right ankle and midfoot right doing well with minimal inflammation noted and mild discomfort with movement of inversion and eversion     Assessment:  Doing well overall with patient responding well to conservative treatment postsurgically     Plan:  Final x-rays reviewed and recommended continued compression elevation and gradual return to normal activity.  Will occur but it is going to take time due to the intensity of the surgery performed  X-ray indicates that the patient's right midfoot is doing well with excellent resection of bone in good alignment noted

## 2018-05-16 ENCOUNTER — Emergency Department (HOSPITAL_BASED_OUTPATIENT_CLINIC_OR_DEPARTMENT_OTHER): Payer: Self-pay

## 2018-05-16 ENCOUNTER — Emergency Department (HOSPITAL_BASED_OUTPATIENT_CLINIC_OR_DEPARTMENT_OTHER)
Admission: EM | Admit: 2018-05-16 | Discharge: 2018-05-16 | Disposition: A | Discharge status: Home / Self Care | Payer: Self-pay | Attending: Emergency Medicine | Admitting: Emergency Medicine

## 2018-05-16 ENCOUNTER — Other Ambulatory Visit: Payer: Self-pay

## 2018-05-16 ENCOUNTER — Encounter (HOSPITAL_BASED_OUTPATIENT_CLINIC_OR_DEPARTMENT_OTHER): Payer: Self-pay | Admitting: Emergency Medicine

## 2018-05-16 DIAGNOSIS — M79661 Pain in right lower leg: Secondary | ICD-10-CM | POA: Insufficient documentation

## 2018-05-16 DIAGNOSIS — Z87891 Personal history of nicotine dependence: Secondary | ICD-10-CM | POA: Insufficient documentation

## 2018-05-16 MED ORDER — HYDROCODONE-ACETAMINOPHEN 5-325 MG PO TABS: 1.0000 | ORAL_TABLET | ORAL | 0 refills | Status: AC | PRN

## 2018-05-16 NOTE — ED Provider Notes (Signed)
[facility] EMERGENCY DEPARTMENT Provider Note   CSN: [id number] Arrival date & time: 05/16/18  1003     History   Chief Complaint Chief Complaint  Patient presents with  . Leg Pain    HPI EISA CONAWAY is a 37 y.o. female.  Pt presents to the ED today with RLE pain.  The pt said it's been there for 1 week.  It hurts with movement.  No swelling.  No travel.  No injury.     No past medical history on file.  Patient Active Problem List   Diagnosis Date Noted  . Right low back pain 01/31/2016  . Myalgia and myositis     Past Surgical History:  Procedure Laterality Date  . ABDOMINAL HYSTERECTOMY     endometriosis  . BREAST SURGERY     Augmentation  . CESAREAN SECTION     x3   . FOOT SURGERY Right 06/2017   Right foot/ ankle  - s/p injury at work  . TUBAL LIGATION       OB History    Gravida  3   Para  3   Term      Preterm      AB      Living  3     SAB      TAB      Ectopic      Multiple      Live Births               Home Medications    Prior to Admission medications   Medication Sig Start Date End Date Taking? Authorizing Provider  HYDROcodone-acetaminophen (NORCO/VICODIN) 5-325 MG tablet Take 1 tablet by mouth every 4 (four) hours as needed. 05/16/18   Isla Pence, MD    Family History No family history on file.  Social History Social History   Tobacco Use  . Smoking status: Former Smoker    Packs/day: 0.50    Years: 20.00    Pack years: 10.00    Types: Cigarettes    Last attempt to quit: 05/16/2014    Years since quitting: 4.0  . Smokeless tobacco: Never Used  Substance Use Topics  . Alcohol use: No  . Drug use: No     Allergies   Diclofenac; Ibuprofen; and Nsaids   Review of Systems Review of Systems  Musculoskeletal:       Right LE pain  All other systems reviewed and are negative.    Physical Exam Updated Vital Signs BP (!) 174/101 (BP Location: Right Arm)   Pulse 85   Temp  98.9 F (37.2 C) (Oral)   Resp 18   Ht 5\' 5"  (1.651 m)   Wt 90.7 kg   SpO2 99%   BMI 33.28 kg/m   Physical Exam Vitals signs and nursing note reviewed.  Constitutional:      Appearance: Normal appearance. She is normal weight.  HENT:     Head: Normocephalic and atraumatic.     Right Ear: Tympanic membrane normal.     Left Ear: Tympanic membrane normal.     Nose: Nose normal.     Mouth/Throat:     Mouth: Mucous membranes are moist.     Pharynx: Oropharynx is clear.  Eyes:     Extraocular Movements: Extraocular movements intact.     Pupils: Pupils are equal, round, and reactive to light.  Neck:     Musculoskeletal: Normal range of motion.  Cardiovascular:     Rate and  Rhythm: Normal rate and regular rhythm.     Pulses: Normal pulses.     Heart sounds: Normal heart sounds.  Pulmonary:     Effort: Pulmonary effort is normal.     Breath sounds: Normal breath sounds.  Abdominal:     General: Abdomen is flat.     Palpations: Abdomen is soft.  Musculoskeletal:     Right lower leg: She exhibits tenderness.       Legs:  Skin:    General: Skin is warm.     Capillary Refill: Capillary refill takes less than 2 seconds.  Neurological:     General: No focal deficit present.     Mental Status: She is alert and oriented to person, place, and time.  Psychiatric:        Mood and Affect: Mood normal.        Behavior: Behavior normal.        Thought Content: Thought content normal.        Judgment: Judgment normal.      ED Treatments / Results  Labs (all labs ordered are listed, but only abnormal results are displayed) Labs Reviewed - No data to display  EKG None  Radiology US Venous Img Lower Unilateral Right  Result Date: 05/16/2018 CLINICAL DATA:  37 year old female with a history of right calf pain EXAM: RIGHT LOWER EXTREMITY VENOUS DOPPLER ULTRASOUND TECHNIQUE: Gray-scale sonography with graded compression, as well as color Doppler and duplex ultrasound were  performed to evaluate the lower extremity deep venous systems from the level of the common femoral vein and including the common femoral, femoral, profunda femoral, popliteal and calf veins including the posterior tibial, peroneal and gastrocnemius veins when visible. The superficial great saphenous vein was also interrogated. Spectral Doppler was utilized to evaluate flow at rest and with distal augmentation maneuvers in the common femoral, femoral and popliteal veins. COMPARISON:  None. FINDINGS: Contralateral Common Femoral Vein: Respiratory phasicity is normal and symmetric with the symptomatic side. No evidence of thrombus. Normal compressibility. Common Femoral Vein: No evidence of thrombus. Normal compressibility, respiratory phasicity and response to augmentation. Saphenofemoral Junction: No evidence of thrombus. Normal compressibility and flow on color Doppler imaging. Profunda Femoral Vein: No evidence of thrombus. Normal compressibility and flow on color Doppler imaging. Femoral Vein: No evidence of thrombus. Normal compressibility, respiratory phasicity and response to augmentation. Popliteal Vein: No evidence of thrombus. Normal compressibility, respiratory phasicity and response to augmentation. Calf Veins: No evidence of thrombus. Normal compressibility and flow on color Doppler imaging. Superficial Great Saphenous Vein: No evidence of thrombus. Normal compressibility and flow on color Doppler imaging. Other Findings:  None. IMPRESSION: Sonographic survey of the right lower extremity negative for DVT Electronically Signed   By: Corrie Mckusick D.O.   On: 05/16/2018 12:33    Procedures Procedures (including critical care time)  Medications Ordered in ED Medications - No data to display   Initial Impression / Assessment and Plan / ED Course  I have reviewed the triage vital signs and the nursing notes.  Pertinent labs & imaging results that were available during my care of the patient were  reviewed by me and considered in my medical decision making (see chart for details).    [place] is negative.  Pt will be d/c home with lortab (#10).  She knows to return if worse.  F/u with pcp.  Final Clinical Impressions(s) / ED Diagnoses   Final diagnoses:  Right calf pain    ED Discharge Orders  Ordered    HYDROcodone-acetaminophen (NORCO/VICODIN) 5-325 MG tablet  Every 4 hours PRN     05/16/18 1305           Isla Pence, MD 05/16/18 1306

## 2018-05-16 NOTE — ED Triage Notes (Signed)
Right calf pain for one week.  Increases with movement.  No fever.  No known injury.

## 2018-07-16 ENCOUNTER — Encounter (HOSPITAL_BASED_OUTPATIENT_CLINIC_OR_DEPARTMENT_OTHER): Payer: Self-pay | Admitting: Emergency Medicine

## 2018-07-16 ENCOUNTER — Emergency Department (HOSPITAL_BASED_OUTPATIENT_CLINIC_OR_DEPARTMENT_OTHER)
Admission: EM | Admit: 2018-07-16 | Discharge: 2018-07-17 | Disposition: A | Discharge status: Home / Self Care | Payer: Self-pay | Attending: Emergency Medicine | Admitting: Emergency Medicine

## 2018-07-16 ENCOUNTER — Other Ambulatory Visit: Payer: Self-pay

## 2018-07-16 ENCOUNTER — Emergency Department (HOSPITAL_BASED_OUTPATIENT_CLINIC_OR_DEPARTMENT_OTHER): Payer: Self-pay

## 2018-07-16 DIAGNOSIS — Z87891 Personal history of nicotine dependence: Secondary | ICD-10-CM | POA: Insufficient documentation

## 2018-07-16 DIAGNOSIS — R519 Headache, unspecified: Secondary | ICD-10-CM

## 2018-07-16 DIAGNOSIS — R51 Headache: Secondary | ICD-10-CM | POA: Insufficient documentation

## 2018-07-16 LAB — PREGNANCY, URINE: PREG TEST UR: NEGATIVE

## 2018-07-16 MED ORDER — SODIUM CHLORIDE 0.9 % IV BOLUS
1000.0000 mL | Freq: Once | INTRAVENOUS | Status: AC
  Administered 2018-07-16: 1000 mL via INTRAVENOUS

## 2018-07-16 MED ORDER — METOCLOPRAMIDE HCL 5 MG/ML IJ SOLN
10.0000 mg | Freq: Once | INTRAMUSCULAR | Status: AC
  Administered 2018-07-16: 10 mg via INTRAVENOUS
  Filled 2018-07-16: qty 2

## 2018-07-16 MED ORDER — DEXAMETHASONE SODIUM PHOSPHATE 10 MG/ML IJ SOLN
10.0000 mg | Freq: Once | INTRAMUSCULAR | Status: AC
  Administered 2018-07-16: 10 mg via INTRAVENOUS
  Filled 2018-07-16: qty 1

## 2018-07-16 NOTE — ED Notes (Addendum)
Pt c/o headache in the back of her head x 4 weeks, that started on right side with the feeling of "being shocked". She has not taken medication because pain is not responding to tylenol, and she is allergic to NSAIDS. Pt also states she has a "knot" on the back of her head that is tender to touch. Pt denies injury. C/o nausea, but denies vomiting. Denies fever

## 2018-07-16 NOTE — ED Provider Notes (Signed)
[facility] Provider Note   CSN: [id number] Arrival date & time: 07/16/18  1945     History   Chief Complaint Chief Complaint  Patient presents with  . Headache  . Mass    HPI Linda Villarreal is a 38 y.o. female.  Patient is a 38 year old female with past medical history of myositis presenting with complaints of headache.  She states that she has had an ongoing headache to the back of her head for the past month.  She is tried taking Tylenol with little relief.  The headache started out as a sudden, sharp, stabbing sensation to the back of her head that lasted a short period of time, but has had total pain since.  She denies any visual disturbances, numbness, or tingling.  The history is provided by the patient.  Headache  Pain location:  Occipital Quality:  Stabbing Radiates to:  Does not radiate Onset quality:  Sudden Timing:  Constant Progression:  Worsening Chronicity:  New Context: not caffeine   Relieved by:  Nothing Worsened by:  Nothing Ineffective treatments:  None tried Associated symptoms: no blurred vision, no fever and no paresthesias     History reviewed. No pertinent past medical history.  Patient Active Problem List   Diagnosis Date Noted  . Right low back pain 01/31/2016  . Myalgia and myositis     Past Surgical History:  Procedure Laterality Date  . ABDOMINAL HYSTERECTOMY     endometriosis  . BREAST SURGERY     Augmentation  . CESAREAN SECTION     x3   . FOOT SURGERY Right 06/2017   Right foot/ ankle  - s/p injury at work  . TUBAL LIGATION       OB History    Gravida  3   Para  3   Term      Preterm      AB      Living  3     SAB      TAB      Ectopic      Multiple      Live Births               Home Medications    Prior to Admission medications   Medication Sig Start Date End Date Taking? Authorizing Provider  HYDROcodone-acetaminophen (NORCO/VICODIN) 5-325 MG tablet Take 1  tablet by mouth every 4 (four) hours as needed. 05/16/18   Isla Pence, MD    Family History No family history on file.  Social History Social History   Tobacco Use  . Smoking status: Former Smoker    Packs/day: 0.50    Years: 20.00    Pack years: 10.00    Types: Cigarettes    Last attempt to quit: 05/16/2014    Years since quitting: 4.1  . Smokeless tobacco: Never Used  Substance Use Topics  . Alcohol use: No    Comment: occ  . Drug use: No     Allergies   Diclofenac; Ibuprofen; and Nsaids   Review of Systems Review of Systems  Constitutional: Negative for fever.  Eyes: Negative for blurred vision.  Neurological: Positive for headaches. Negative for paresthesias.  All other systems reviewed and are negative.    Physical Exam Updated Vital Signs BP 138/90 (BP Location: Right Arm)   Pulse 85   Temp 97.9 F (36.6 C) (Oral)   Resp 16   Ht 5\' 5"  (1.651 m)   Wt 90.7 kg  SpO2 99%   BMI 33.28 kg/m   Physical Exam Vitals signs and nursing note reviewed.  Constitutional:      General: She is not in acute distress.    Appearance: She is well-developed. She is not diaphoretic.  HENT:     Head: Normocephalic and atraumatic.  Eyes:     General: No visual field deficit.    Extraocular Movements: Extraocular movements intact.     Pupils: Pupils are equal, round, and reactive to light.  Neck:     Musculoskeletal: Normal range of motion and neck supple.  Cardiovascular:     Rate and Rhythm: Normal rate and regular rhythm.     Heart sounds: No murmur. No friction rub. No gallop.   Pulmonary:     Effort: Pulmonary effort is normal. No respiratory distress.     Breath sounds: Normal breath sounds. No wheezing.  Abdominal:     General: Bowel sounds are normal. There is no distension.     Palpations: Abdomen is soft.     Tenderness: There is no abdominal tenderness.  Musculoskeletal: Normal range of motion.  Skin:    General: Skin is warm and dry.    Neurological:     Mental Status: She is alert and oriented to person, place, and time.     Cranial Nerves: No cranial nerve deficit.      ED Treatments / Results  Labs (all labs ordered are listed, but only abnormal results are displayed) Labs Reviewed  PREGNANCY, URINE    EKG None  Radiology No results found.  Procedures Procedures (including critical care time)  Medications Ordered in ED Medications  sodium chloride 0.9 % bolus 1,000 mL (has no administration in time range)  metoCLOPramide (REGLAN) injection 10 mg (has no administration in time range)  dexamethasone (DECADRON) injection 10 mg (has no administration in time range)     Initial Impression / Assessment and Plan / ED Course  I have reviewed the triage vital signs and the nursing notes.  Pertinent labs & imaging results that were available during my care of the patient were reviewed by me and considered in my medical decision making (see chart for details).  Patient's neuro exam is nonfocal and head CT is negative.  She is feeling better after Reglan and Decadron.  At this point, I see no indication for further work-up.  She is to follow-up with primary doctor if not improving.  Final Clinical Impressions(s) / ED Diagnoses   Final diagnoses:  None    ED Discharge Orders    None       Veryl Speak, MD 07/17/18 [phone]

## 2018-07-16 NOTE — ED Triage Notes (Signed)
Pt states that she has had a headache over a month now. Pt states that she experienced sharp stabbing pains twice in a week and after that she has had the headache. States she can bend her neck down and feels a lump in the center of her neck and has been nauseous. Pt states that she has a hx of migraines but they go away with treatment and this feels different. States she has tried OTC tylenol without relieft

## 2018-07-17 MED ORDER — TRAMADOL HCL 50 MG PO TABS: 50.0000 mg | ORAL_TABLET | Freq: Four times a day (QID) | ORAL | 0 refills | Status: DC | PRN

## 2018-07-17 NOTE — ED Notes (Signed)
Pt states she is ready for dc home. EDP made aware.

## 2018-07-17 NOTE — Discharge Instructions (Signed)
Continue taking Tylenol 1000 mg every 6 hours as needed for pain.  Tramadol as prescribed as needed for pain not relieved with Tylenol.

## 2018-11-11 IMAGING — MR MR ANKLE*R* W/O CM
4 of 6 series · 23 of 40 positions shown · non-contrast
Comparison: Radiographs 06/02/2017

CLINICAL DATA: Lateral ankle pain with popping sensation.

EXAM:
MRI OF THE RIGHT ANKLE WITHOUT CONTRAST
TECHNIQUE: Multiplanar, multisequence MR imaging of the ankle was performed. No
intravenous contrast was administered.

[Series 3: PD fat-sat · axial · 3.5mm · 0.31mm/px · z∈[-109,+4]mm · 8 of 27 slices shown]
[im 1/27]
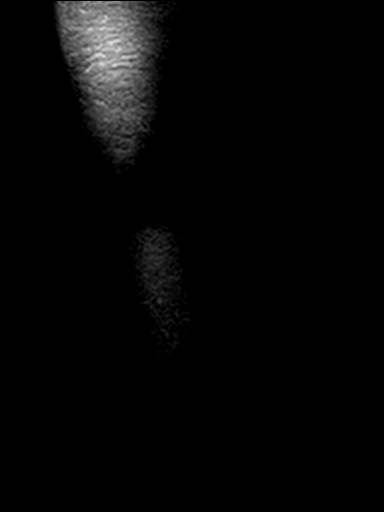
[im 4/27]
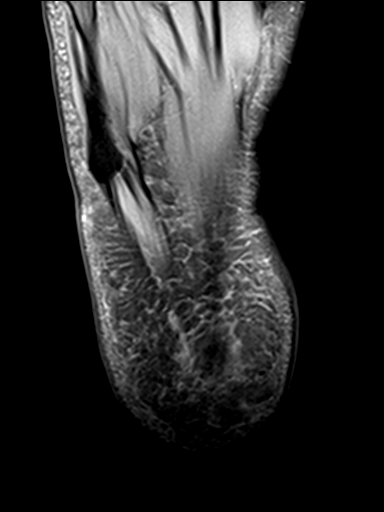
[im 8/27]
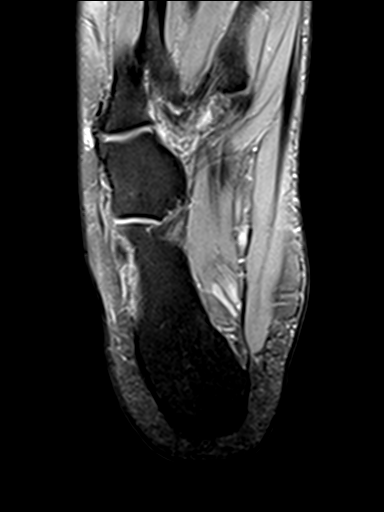
[im 12/27]
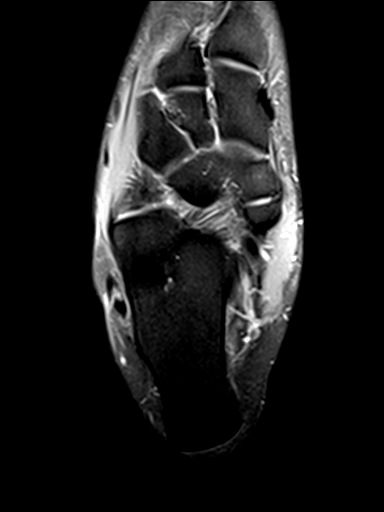
[im 15/27]
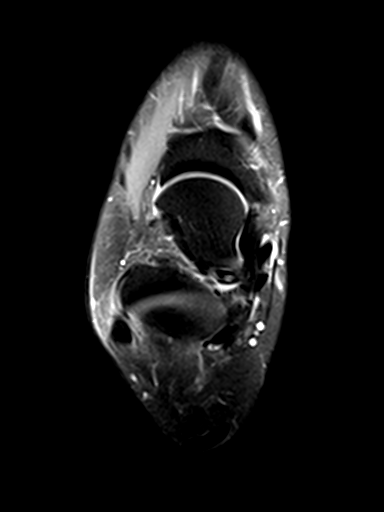
[im 19/27]
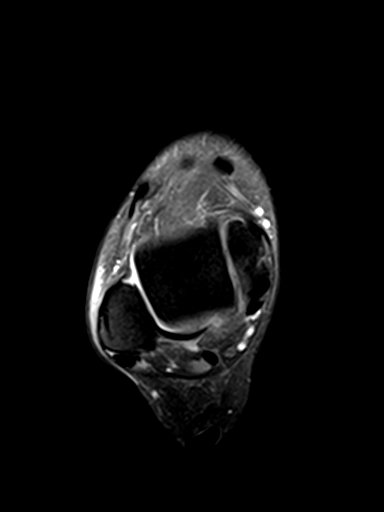
[im 23/27]
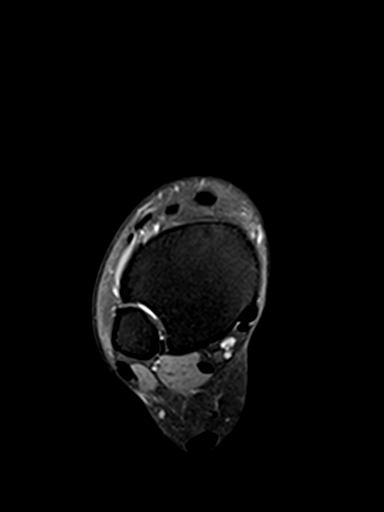
[im 27/27]
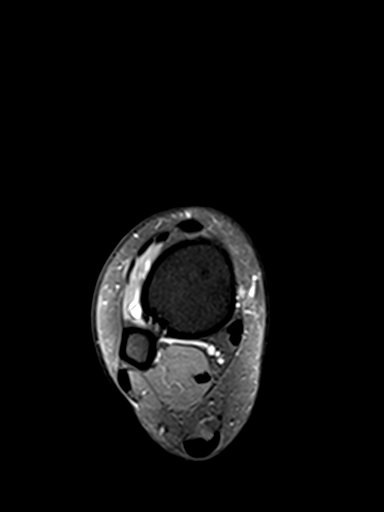

[Series 4: T2 fat-sat · axial · 3.5mm · 0.31mm/px · z∈[-109,+4]mm · 7 of 27 slices shown (1 of 3)]
[im 1/27]
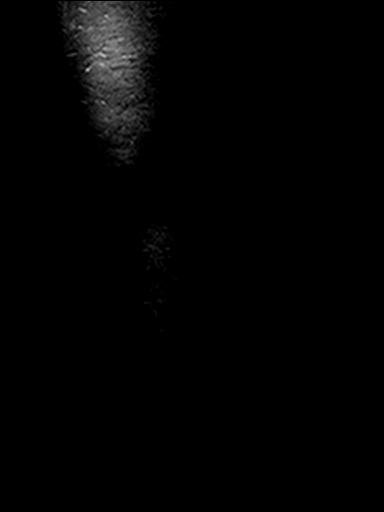
[im 5/27]
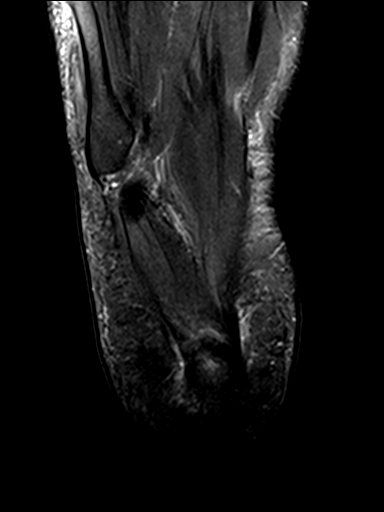
[im 9/27]
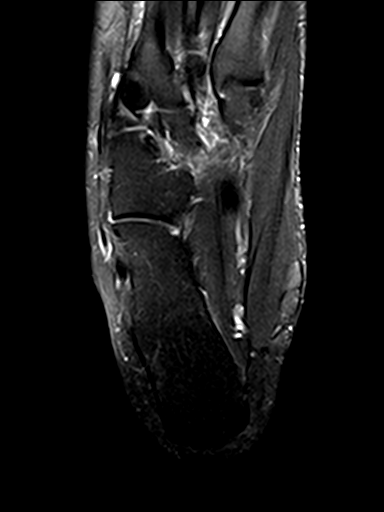
[im 14/27]
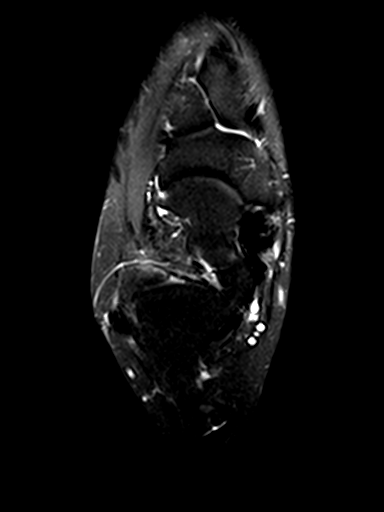
[im 18/27]
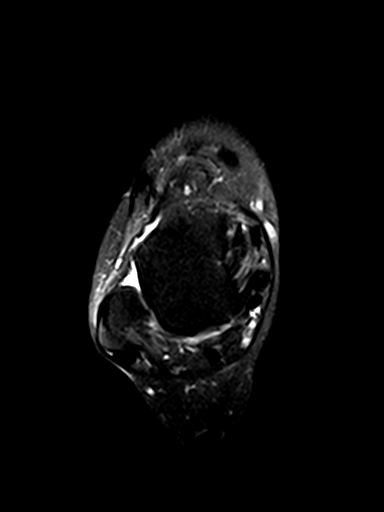
[im 22/27]
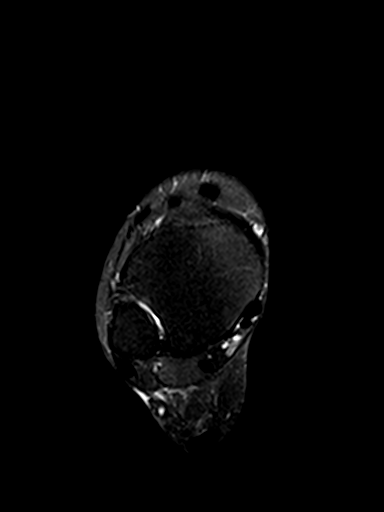
[im 27/27]
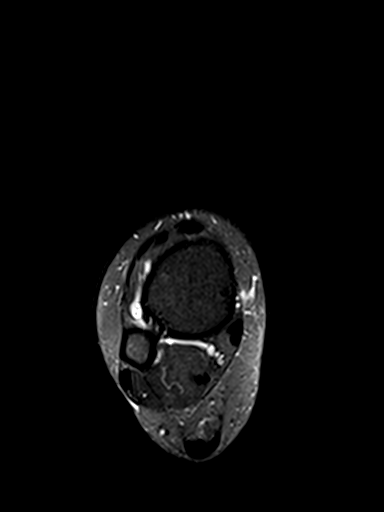

[Series 6: T2 fat-sat · sagittal · 3.0mm · 0.31mm/px · 5 of 20 slices shown (2 of 3)]
[im 1/20]
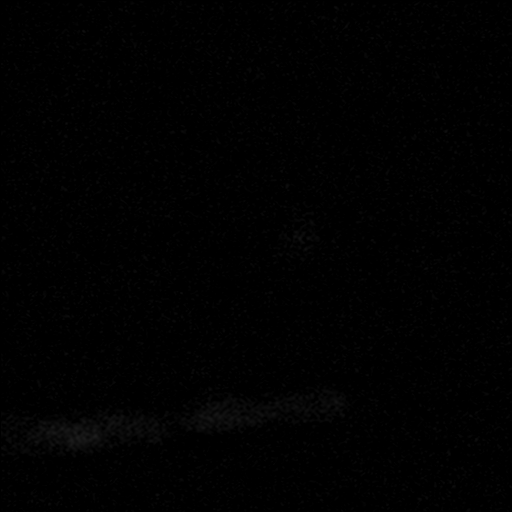
[im 5/20]
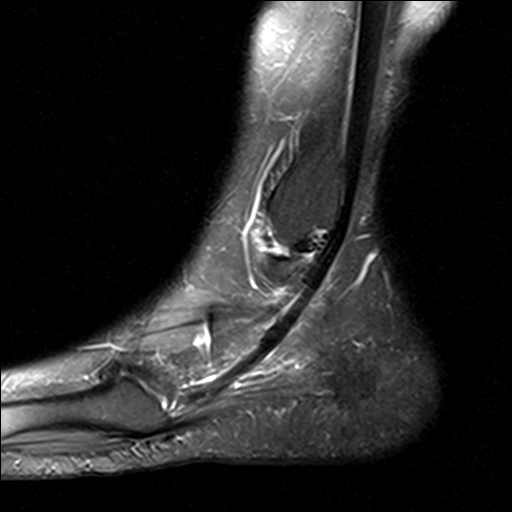
[im 10/20]
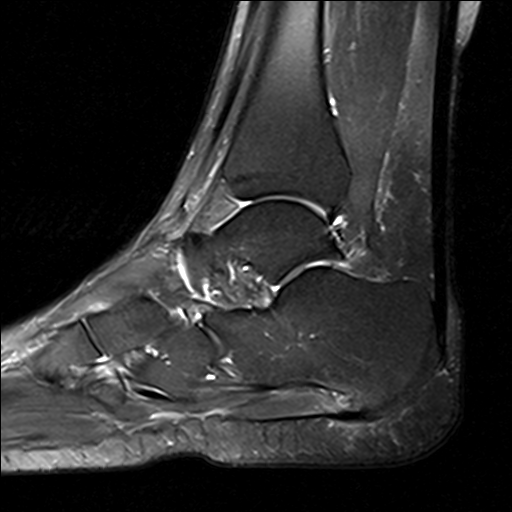
[im 15/20]
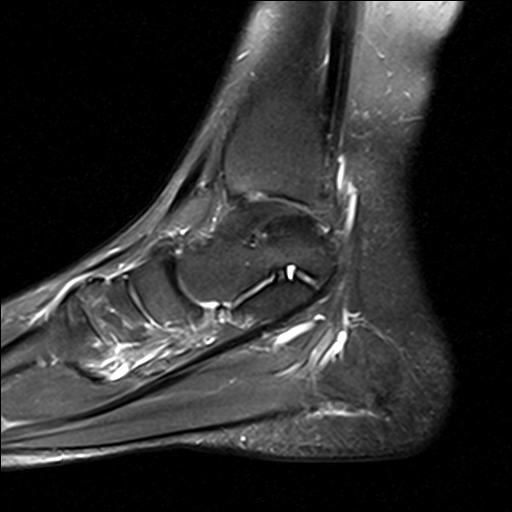
[im 20/20]
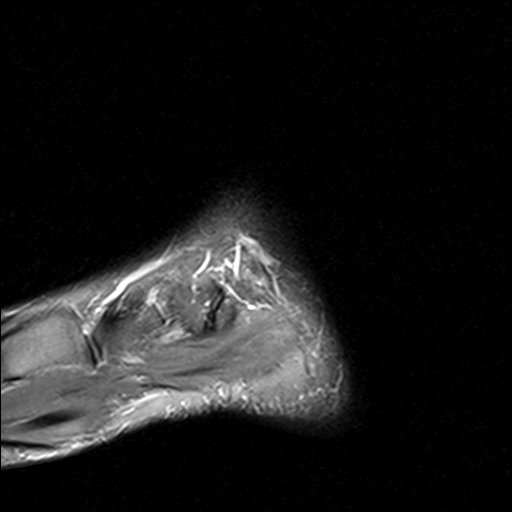

[Series 7: T2 fat-sat · coronal · 4.0mm · 0.31mm/px · 3 of 30 slices shown (3 of 3)]
[im 5/30]
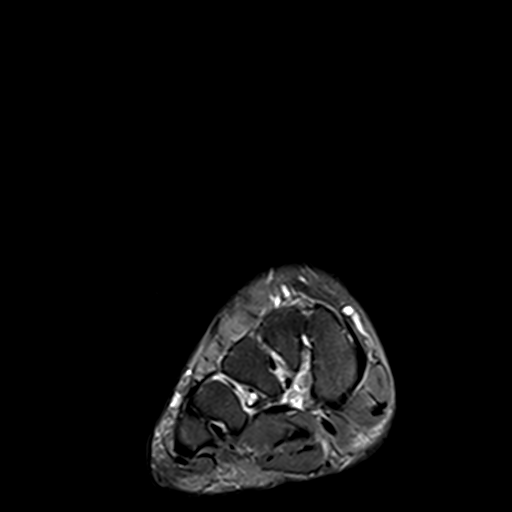
[im 17/30]
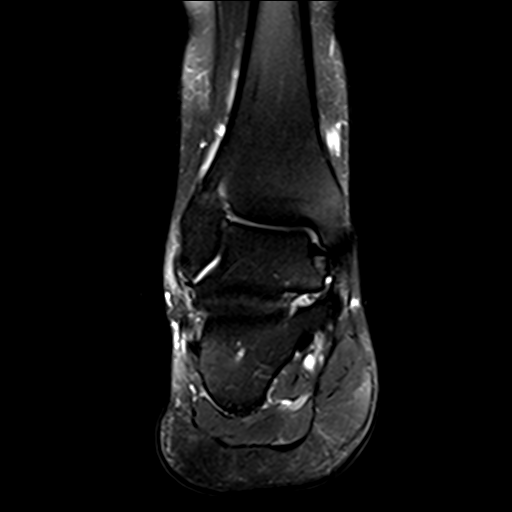
[im 25/30]
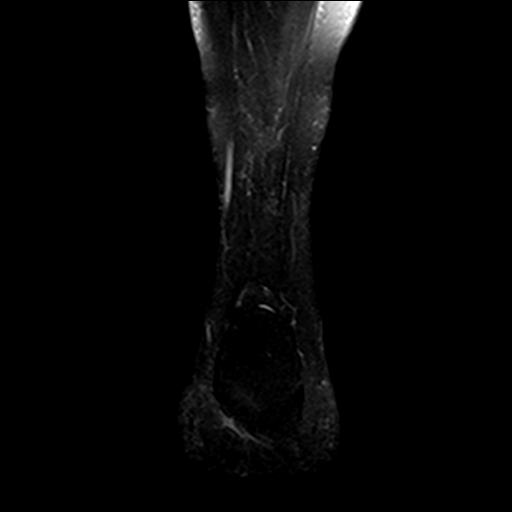

[23 of 40 positions shown; findings below may reference images not displayed]

FINDINGS: TENDONS

Peroneal: Longitudinal split type tear involving the proximal
peroneus longus tendon. Mild tenosynovitis involving the peroneus
brevis tendon.

Posteromedial: Intact. Mild tenosynovitis involving the posterior
tibialis tendon. There is a type 2 os naviculare but no edema to
suggest an ongoing stress related process.

Anterior: Normal

Achilles: Normal

Plantar Fascia: Intact. Minimal marrow edema at the plantar fascia
attachment on the Achilles.

LIGAMENTS

Lateral: Intact

Medial: Intact

CARTILAGE

Ankle Joint: No degenerative changes. No cartilage defect or
osteochondral lesion. No joint effusion.

Subtalar Joints/Sinus Tarsi: The subtalar joints are maintained. No
significant degenerative changes. The sinus tarsi is normal. The
cervical and interosseous ligaments are intact. The spring ligament
is intact.

Bones: No stress fracture or osteochondral lesion. Mild dorsal
spurring off the navicular bone.

Other: Normal appearance of the foot and ankle musculature.
IMPRESSION: 1. Short segment longitudinal split type tear and tendinopathy
involving the proximal peroneus longus tendon.
2. Mild tenosynovitis involving the peroneus brevis tendon and
posterior tibialis tendon.
3. Type 2 os naviculare. No edema to suggest a stress related
process.
4. Intact medial and lateral ankle ligaments.
5. No acute bony findings.

## 2019-02-23 ENCOUNTER — Other Ambulatory Visit: Payer: Self-pay

## 2019-02-23 ENCOUNTER — Encounter (HOSPITAL_BASED_OUTPATIENT_CLINIC_OR_DEPARTMENT_OTHER): Payer: Self-pay | Admitting: *Deleted

## 2019-02-23 ENCOUNTER — Emergency Department (HOSPITAL_BASED_OUTPATIENT_CLINIC_OR_DEPARTMENT_OTHER)
Admission: EM | Admit: 2019-02-23 | Discharge: 2019-02-23 | Disposition: A | Discharge status: Home / Self Care | Payer: Medicaid Other | Attending: Emergency Medicine | Admitting: Emergency Medicine

## 2019-02-23 DIAGNOSIS — Z87891 Personal history of nicotine dependence: Secondary | ICD-10-CM | POA: Insufficient documentation

## 2019-02-23 DIAGNOSIS — G43109 Migraine with aura, not intractable, without status migrainosus: Secondary | ICD-10-CM | POA: Insufficient documentation

## 2019-02-23 MED ORDER — DIPHENHYDRAMINE HCL 50 MG/ML IJ SOLN
12.5000 mg | Freq: Once | INTRAMUSCULAR | Status: AC
  Administered 2019-02-23: 20:00:00 12.5 mg via INTRAVENOUS
  Filled 2019-02-23: qty 1

## 2019-02-23 MED ORDER — SUMATRIPTAN SUCCINATE 6 MG/0.5ML ~~LOC~~ SOLN
6.0000 mg | Freq: Once | SUBCUTANEOUS | Status: DC
  Filled 2019-02-23: qty 0.5

## 2019-02-23 MED ORDER — PROCHLORPERAZINE EDISYLATE 10 MG/2ML IJ SOLN
10.0000 mg | Freq: Once | INTRAMUSCULAR | Status: AC
  Administered 2019-02-23: 20:00:00 10 mg via INTRAVENOUS
  Filled 2019-02-23: qty 2

## 2019-02-23 MED ORDER — METOCLOPRAMIDE HCL 5 MG/ML IJ SOLN
10.0000 mg | Freq: Once | INTRAMUSCULAR | Status: AC
  Administered 2019-02-23: 20:00:00 10 mg via INTRAVENOUS
  Filled 2019-02-23: qty 2

## 2019-02-23 MED ORDER — PROCHLORPERAZINE EDISYLATE 10 MG/2ML IJ SOLN
10.0000 mg | Freq: Once | INTRAMUSCULAR | Status: AC
  Administered 2019-02-23: 22:00:00 10 mg via INTRAVENOUS
  Filled 2019-02-23: qty 2

## 2019-02-23 MED ORDER — ONDANSETRON HCL 4 MG/2ML IJ SOLN
4.0000 mg | Freq: Once | INTRAMUSCULAR | Status: DC
  Filled 2019-02-23: qty 2

## 2019-02-23 MED ORDER — CYCLOBENZAPRINE HCL 5 MG PO TABS: 5.0000 mg | ORAL_TABLET | Freq: Three times a day (TID) | ORAL | 0 refills | Status: AC | PRN

## 2019-02-23 NOTE — ED Notes (Signed)
ED Provider at bedside. 

## 2019-02-23 NOTE — ED Provider Notes (Signed)
[facility] DEPART[facility] Provider Note   CSN: [id number] Arrival date & time: 02/23/19  1744     History   Chief Complaint Chief Complaint  Patient presents with  . Migraine    HPI Linda Villarreal is a 38 y.o. female with history of migraines presenting to the ED with two days of migraine and neck pain. Patient reports sudden onset of neck pain when she woke up yesterday that has persisted as a result of which she finds it difficult to move her head. Since then, she has developed a fontal headache associated with nausea and photophobia, which is consistent with her usual migraines. Patient denies any fevers, chills, vomiting, recent sick contacts, or focal weakness.     Patient Active Problem List   Diagnosis Date Noted  . Right low back pain 01/31/2016  . Myalgia and myositis     Past Surgical History:  Procedure Laterality Date  . ABDOMINAL HYSTERECTOMY     endometriosis  . BREAST SURGERY     Augmentation  . CESAREAN SECTION     x3   . FOOT SURGERY Right 06/2017   Right foot/ ankle  - s/p injury at work  . TUBAL LIGATION       OB History    Gravida  3   Para  3   Term      Preterm      AB      Living  3     SAB      TAB      Ectopic      Multiple      Live Births               Home Medications    Prior to Admission medications   Medication Sig Start Date End Date Taking? Authorizing Provider  cyclobenzaprine (FLEXERIL) 5 MG tablet Take 1 tablet (5 mg total) by mouth 3 (three) times daily as needed for muscle spasms. 02/23/19   Harvie Heck, MD  HYDROcodone-acetaminophen (NORCO/VICODIN) 5-325 MG tablet Take 1 tablet by mouth every 4 (four) hours as needed. 05/16/18   Isla Pence, MD    Family History History reviewed. No pertinent family history.  Social History Social History   Tobacco Use  . Smoking status: Former Smoker    Packs/day: 0.50    Years: 20.00    Pack years: 10.00    Types: Cigarettes    Quit  date: 05/16/2014    Years since quitting: 4.7  . Smokeless tobacco: Never Used  Substance Use Topics  . Alcohol use: No    Comment: occ  . Drug use: No     Allergies   Diclofenac, Ibuprofen, and Nsaids   Review of Systems Review of Systems  Constitutional: Negative for activity change, chills, fatigue and fever.  HENT: Negative for congestion, drooling, facial swelling, postnasal drip, sinus pressure, sinus pain, sore throat, tinnitus and trouble swallowing.   Eyes: Positive for photophobia. Negative for pain, discharge and visual disturbance.  Respiratory: Negative for cough, chest tightness, shortness of breath and wheezing.   Cardiovascular: Negative for chest pain and palpitations.  Gastrointestinal: Positive for nausea. Negative for abdominal distention, abdominal pain, constipation, diarrhea and vomiting.  Endocrine: Negative.   Genitourinary: Negative.   Musculoskeletal: Positive for neck pain and neck stiffness. Negative for back pain.  Skin: Negative.   Neurological: Positive for headaches. Negative for dizziness, tremors, speech difficulty, weakness, light-headedness and numbness.  Hematological: Negative.   Psychiatric/Behavioral: Negative.  Physical Exam Updated Vital Signs BP 117/88 (BP Location: Right Arm)   Pulse 71   Temp 98.7 F (37.1 C)   Resp 17   Ht 5\' 5"  (1.651 m)   Wt 96.2 kg   SpO2 99%   BMI 35.28 kg/m   Physical Exam Constitutional:      General: She is in acute distress.     Appearance: Normal appearance. She is not ill-appearing.  HENT:     Head: Normocephalic and atraumatic.     Mouth/Throat:     Mouth: Mucous membranes are moist.     Pharynx: Oropharynx is clear. No oropharyngeal exudate or posterior oropharyngeal erythema.  Eyes:     General: No scleral icterus.    Extraocular Movements: Extraocular movements intact.     Conjunctiva/sclera: Conjunctivae normal.     Pupils: Pupils are equal, round, and reactive to light.  Neck:      Musculoskeletal: Neck supple. Muscular tenderness present.     Vascular: No carotid bruit.  Cardiovascular:     Rate and Rhythm: Normal rate and regular rhythm.     Pulses: Normal pulses.     Heart sounds: Normal heart sounds. No murmur. No friction rub. No gallop.   Pulmonary:     Effort: Pulmonary effort is normal. No respiratory distress.     Breath sounds: Normal breath sounds. No wheezing.  Abdominal:     General: Bowel sounds are normal. There is no distension.     Palpations: Abdomen is soft.     Tenderness: There is no abdominal tenderness. There is no guarding.  Musculoskeletal: Normal range of motion.        General: No swelling or tenderness.  Lymphadenopathy:     Cervical: No cervical adenopathy.  Skin:    General: Skin is warm and dry.     Capillary Refill: Capillary refill takes less than 2 seconds.     Findings: Erythema present.     Comments: Erythema at bilateral shoulders   Neurological:     General: No focal deficit present.     Mental Status: She is alert and oriented to person, place, and time. Mental status is at baseline.     Cranial Nerves: No cranial nerve deficit.     Sensory: No sensory deficit.     Motor: No weakness.     Coordination: Coordination normal.    ED Treatments / Results  Labs (all labs ordered are listed, but only abnormal results are displayed) Labs Reviewed - No data to display  EKG None  Radiology No results found.  Procedures Procedures (including critical care time)  Medications Ordered in ED Medications  prochlorperazine (COMPAZINE) injection 10 mg (10 mg Intravenous Given 02/23/19 2012)  diphenhydrAMINE (BENADRYL) injection 12.5 mg (12.5 mg Intravenous Given 02/23/19 2013)  metoCLOPramide (REGLAN) injection 10 mg (10 mg Intravenous Given 02/23/19 2012)  prochlorperazine (COMPAZINE) injection 10 mg (10 mg Intravenous Given 02/23/19 2209)     Initial Impression / Assessment and Plan / ED Course  I have reviewed the  triage vital signs and the nursing notes.  Pertinent labs & imaging results that were available during my care of the patient were reviewed by me and considered in my medical decision making (see chart for details).   Patient is a 38yo female with history of migraines presenting to the ED with neck pain and stiffness that started suddenly two days ago when she woke up. This has restricted her range of motion in the neck. She also endorses  a frontal headache with associated nausea, photophobia, and sensitivity to sound which is consistent with her usual migraine. She does see some floaters in her peripheral vision. She denies any fever, chills, eye pain, discharge, rhinorrhea, postnasal drip, congestion, sinus pain, sore throat, focal weakness, or recent sick contacts.   Patient is hemodynamically stable but appears to be in distress from the migraine. Patient has tenderness to palpation of the posterior neck muscles and active ROM is limited by pain. Neck is supple. Patient endorses pain radiating down the spine on neck flexion. No focal neurological deficits noted on exam. Incidental finding of erythema at bilateral shoulders noted. Does not appear to be of infectious etiology.  Suspect that patient's symptoms are due to her migraine and neck strain vs meningitis given lack of fever and patient appears stable on examination.  Patient treated with compazine, benadryl and reglan with improvement of symptoms. Patient discharged to home with flexeril as neck pain appears to be musculoskeletal in nature. Strict return precautions provided.    Final Clinical Impressions(s) / ED Diagnoses   Final diagnoses:  Migraine with aura and without status migrainosus, not intractable    ED Discharge Orders         Ordered    cyclobenzaprine (FLEXERIL) 5 MG tablet  3 times daily PRN     02/23/19 2249           Harvie Heck, MD 02/24/19 Andale, Dan, DO 02/24/19 1502

## 2019-02-23 NOTE — ED Triage Notes (Signed)
Pt c/o neck pain and migraines x 2 days

## 2020-05-27 ENCOUNTER — Emergency Department: Admit: 2020-05-27 | Discharge status: Absent without leave | Payer: Self-pay

## 2020-05-27 ENCOUNTER — Other Ambulatory Visit: Payer: Self-pay

## 2020-05-27 ENCOUNTER — Emergency Department
Admission: EM | Admit: 2020-05-27 | Discharge: 2020-05-27 | Disposition: A | Discharge status: Home / Self Care | Payer: Self-pay | Source: Home / Self Care

## 2020-05-27 DIAGNOSIS — L509 Urticaria, unspecified: Secondary | ICD-10-CM

## 2020-05-27 DIAGNOSIS — L500 Allergic urticaria: Secondary | ICD-10-CM

## 2020-05-27 MED ORDER — SARNA 0.5-0.5 % EX LOTN: 1.0000 "application " | TOPICAL_LOTION | CUTANEOUS | 0 refills | Status: AC | PRN

## 2020-05-27 NOTE — Discharge Instructions (Addendum)
Take Claritin twice daily for rash

## 2020-05-27 NOTE — ED Provider Notes (Signed)
[facility]    CSN: [id number] Arrival date & time: 05/27/20  1524      History   Chief Complaint Chief Complaint  Patient presents with  . Rash    HPI Linda Villarreal is a 39 y.o. female.   Patient has rash on both forearms.  This is the third occurrence.  There is some pruritus.  Confined to forearms.  She does have pets in the house.  But knows of no other possible allergens.  HPI  History reviewed. No pertinent past medical history.  Patient Active Problem List   Diagnosis Date Noted  . Right low back pain 01/31/2016  . Myalgia and myositis     Past Surgical History:  Procedure Laterality Date  . ABDOMINAL HYSTERECTOMY     endometriosis  . BREAST SURGERY     Augmentation  . CESAREAN SECTION     x3   . FOOT SURGERY Right 06/2017   Right foot/ ankle  - s/p injury at work  . TUBAL LIGATION      OB History    Gravida  3   Para  3   Term      Preterm      AB      Living  3     SAB      IAB      Ectopic      Multiple      Live Births               Home Medications    Prior to Admission medications   Medication Sig Start Date End Date Taking? Authorizing Provider  cyclobenzaprine (FLEXERIL) 5 MG tablet Take 1 tablet (5 mg total) by mouth 3 (three) times daily as needed for muscle spasms. 02/23/19   Harvie Heck, MD  HYDROcodone-acetaminophen (NORCO/VICODIN) 5-325 MG tablet Take 1 tablet by mouth every 4 (four) hours as needed. 05/16/18   Isla Pence, MD    Family History Family History  Problem Relation Age of Onset  . Cancer Mother   . Healthy Father     Social History Social History   Tobacco Use  . Smoking status: Former Smoker    Packs/day: 0.50    Years: 20.00    Pack years: 10.00    Types: Cigarettes    Quit date: 05/16/2014    Years since quitting: 6.0  . Smokeless tobacco: Never Used  Vaping Use  . Vaping Use: Never used  Substance Use Topics  . Alcohol use: No    Comment: occ  . Drug use:  No     Allergies   Diclofenac, Ibuprofen, and Nsaids   Review of Systems Review of Systems  Skin: Positive for rash.       Bilateral forearms  All other systems reviewed and are negative.    Physical Exam Triage Vital Signs ED Triage Vitals  Enc Vitals Group     BP 05/27/20 1548 (!) 147/92     Pulse Rate 05/27/20 1548 92     Resp 05/27/20 1548 18     Temp 05/27/20 1548 98.9 F (37.2 C)     Temp Source 05/27/20 1548 Oral     SpO2 05/27/20 1548 97 %     Weight --      Height --      Head Circumference --      Peak Flow --      Pain Score 05/27/20 1545 3     Pain Loc --  Pain Edu? --      Excl. in [place]? --    No data found.  Updated Vital Signs BP (!) 147/92 (BP Location: Right Arm)   Pulse 92   Temp 98.9 F (37.2 C) (Oral)   Resp 18   SpO2 97%   Visual Acuity Right Eye Distance:   Left Eye Distance:   Bilateral Distance:    Right Eye Near:   Left Eye Near:    Bilateral Near:     Physical Exam Vitals and nursing note reviewed.  Constitutional:      Appearance: Normal appearance.  Cardiovascular:     Rate and Rhythm: Normal rate.  Pulmonary:     Effort: Pulmonary effort is normal.  Skin:    Comments: Patient has urticaria on forearms.  There is one small circular area on the left arm suspicious for tinea corporis  Neurological:     General: No focal deficit present.     Mental Status: She is alert and oriented to person, place, and time.      UC Treatments / Results  Labs (all labs ordered are listed, but only abnormal results are displayed) Labs Reviewed - No data to display  EKG   Radiology No results found.  Procedures Procedures (including critical care time)  Medications Ordered in UC Medications - No data to display  Initial Impression / Assessment and Plan / UC Course  I have reviewed the triage vital signs and the nursing notes.  Pertinent labs & imaging results that were available during my care of the patient were  reviewed by me and considered in my medical decision making (see chart for details).     Skin rash, probable allergic reaction. Final Clinical Impressions(s) / UC Diagnoses   Final diagnoses:  None   Discharge Instructions   None    ED Prescriptions    None     PDMP not reviewed this encounter.   Wardell Honour, MD 05/27/20 [phone]

## 2020-05-27 NOTE — ED Triage Notes (Signed)
Patient presents to Urgent Care with complaints of itchy rash on right forearm that has just spread over to the left forearm since last night. Patient reports she has had it intermittently over the past 4 weeks. Pt has applied neosporin, vitamin e oil, no improvement.
